# Patient Record
Sex: Male | Born: 1965 | Race: Black or African American | Hispanic: No | Smoking: Never smoker
Health system: Southern US, Community
[De-identification: ages and names within clinical notes are randomized; demographics above are authoritative.]

## PROBLEM LIST (undated history)

## (undated) DIAGNOSIS — Z86718 Personal history of other venous thrombosis and embolism: Secondary | ICD-10-CM

## (undated) DIAGNOSIS — I10 Essential (primary) hypertension: Secondary | ICD-10-CM

## (undated) DIAGNOSIS — E7800 Pure hypercholesterolemia, unspecified: Secondary | ICD-10-CM

## (undated) HISTORY — DX: Pure hypercholesterolemia, unspecified: E78.00

## (undated) HISTORY — DX: Essential (primary) hypertension: I10

## (undated) HISTORY — DX: Personal history of other venous thrombosis and embolism: Z86.718

## (undated) HISTORY — PX: NO PAST SURGERIES: SHX2092

---

## 2013-10-16 LAB — BASIC METABOLIC PANEL: GLUCOSE: 97 mg/dL

## 2013-10-16 LAB — HEMOGLOBIN A1C: Hgb A1c MFr Bld: 5.8 % (ref 4.0–6.0)

## 2015-01-07 ENCOUNTER — Other Ambulatory Visit
Admission: RE | Admit: 2015-01-07 | Discharge: 2015-01-07 | Disposition: A | Discharge status: Home / Self Care | Payer: Self-pay | Source: Ambulatory Visit | Attending: Family Medicine | Admitting: Family Medicine

## 2015-01-07 ENCOUNTER — Telehealth: Payer: Self-pay | Admitting: Family Medicine

## 2015-01-07 DIAGNOSIS — I10 Essential (primary) hypertension: Secondary | ICD-10-CM

## 2015-01-07 LAB — COMPREHENSIVE METABOLIC PANEL
ALK PHOS: 46 U/L (ref 38–126)
ALT: 22 U/L (ref 17–63)
AST: 23 U/L (ref 15–41)
Albumin: 3.9 g/dL (ref 3.5–5.0)
Anion gap: 7 (ref 5–15)
BUN: 11 mg/dL (ref 6–20)
CALCIUM: 9.1 mg/dL (ref 8.9–10.3)
CO2: 27 mmol/L (ref 22–32)
Chloride: 106 mmol/L (ref 101–111)
Creatinine, Ser: 1.12 mg/dL (ref 0.61–1.24)
GFR calc Af Amer: >60 mL/min (ref >60)
GLUCOSE: 115 mg/dL — AB (ref 65–99)
Potassium: 3.8 mmol/L (ref 3.5–5.1)
SODIUM: 140 mmol/L (ref 135–145)
TOTAL PROTEIN: 7.5 g/dL (ref 6.5–8.1)
Total Bilirubin: 0.5 mg/dL (ref 0.3–1.2)

## 2015-01-07 NOTE — Telephone Encounter (Signed)
Printed pt lab slip and it is ready to be picked up. Called pt.

## 2015-01-07 NOTE — Telephone Encounter (Signed)
Pt called wanting a lab order for CMP (the original order was done in [employer] on 07/11/14 using diagnosis code I10) so that he can go over to [facility] and have it done today.  He will come by here today to pick up order.  He is wanting to have this lab done before his visit.

## 2015-01-13 ENCOUNTER — Ambulatory Visit (INDEPENDENT_AMBULATORY_CARE_PROVIDER_SITE_OTHER): Payer: Self-pay | Admitting: Family Medicine

## 2015-01-13 ENCOUNTER — Encounter: Payer: Self-pay | Admitting: Family Medicine

## 2015-01-13 VITALS — BP 128/88 | HR 66 | Temp 98.4°F | Resp 18 | Ht 75.0 in | Wt 340.2 lb

## 2015-01-13 DIAGNOSIS — R7303 Prediabetes: Secondary | ICD-10-CM | POA: Insufficient documentation

## 2015-01-13 DIAGNOSIS — N529 Male erectile dysfunction, unspecified: Secondary | ICD-10-CM | POA: Insufficient documentation

## 2015-01-13 DIAGNOSIS — E8881 Metabolic syndrome: Secondary | ICD-10-CM | POA: Insufficient documentation

## 2015-01-13 DIAGNOSIS — E669 Obesity, unspecified: Secondary | ICD-10-CM | POA: Insufficient documentation

## 2015-01-13 DIAGNOSIS — I10 Essential (primary) hypertension: Secondary | ICD-10-CM | POA: Insufficient documentation

## 2015-01-13 MED ORDER — LISINOPRIL-HYDROCHLOROTHIAZIDE 20-12.5 MG PO TABS: 1.0000 | ORAL_TABLET | Freq: Every day | ORAL | Status: DC

## 2015-01-13 NOTE — Progress Notes (Signed)
Name: Austin Howell   MRN: [id number]    DOB: [birth year]   Date:01/13/2015       Progress Note  Subjective  Chief Complaint  Chief Complaint  Patient presents with  . Hypertension  . Obesity    Hypertension This is a chronic problem. The current episode started more than 1 year ago. The problem has been gradually improving since onset. Pertinent negatives include no blurred vision, chest pain, headaches, neck pain, orthopnea, palpitations or shortness of breath. There are no associated agents to hypertension. Risk factors for coronary artery disease include male gender, obesity and sedentary lifestyle. Past treatments include angiotensin blockers and diuretics. The current treatment provides moderate improvement. There are no compliance problems.    Obesity  Patient has a long-standing history of obesity. Since last visit his weight at 3:30 for his increased to 6340. He is not exercising with regularity nor following a diet with regularity.    No past medical history on file.  History  Substance Use Topics  . Smoking status: Never Smoker   . Smokeless tobacco: Not on file  . Alcohol Use: No     Current outpatient prescriptions:  .  lisinopril-hydrochlorothiazide (PRINZIDE,ZESTORETIC) 20-12.5 MG per tablet, 2 tab by mouth every morning, Disp: , Rfl:  .  tadalafil (CIALIS) 20 MG tablet, 1 tab by mouth as needed, 30 minutes before intercourse, do not repeat in 36 hours. Do not use nitroglycerine tablets., Disp: , Rfl:   No Known Allergies  Review of Systems  Constitutional: Negative for fever, chills and weight loss.  HENT: Negative for congestion, hearing loss, sore throat and tinnitus.   Eyes: Negative for blurred vision, double vision and redness.  Respiratory: Negative for cough, hemoptysis and shortness of breath.   Cardiovascular: Positive for leg swelling. Negative for chest pain, palpitations, orthopnea and claudication.  Gastrointestinal: Negative for heartburn, nausea,  vomiting, diarrhea, constipation and blood in stool.  Genitourinary: Negative for dysuria, urgency, frequency and hematuria.  Musculoskeletal: Negative for myalgias, back pain, joint pain, falls and neck pain.  Skin: Negative for itching.  Neurological: Negative for dizziness, tingling, tremors, focal weakness, seizures, loss of consciousness, weakness and headaches.  Endo/Heme/Allergies: Does not bruise/bleed easily.  Psychiatric/Behavioral: Negative for depression and substance abuse. The patient is not nervous/anxious and does not have insomnia.      Objective  Filed Vitals:   01/13/15 0832  BP: 128/88  Pulse: 66  Temp: 98.4 F (36.9 C)  Resp: 18  Height:  (1.905 m)  Weight: 340 lb 4 oz (154.336 kg)  SpO2: 98%     Physical Exam  Constitutional: He is oriented to person, place, and time and well-developed, well-nourished, and in no distress.  Obese  HENT:  Head: Normocephalic.  Eyes: EOM are normal. Pupils are equal, round, and reactive to light.  Neck: Normal range of motion. Neck supple. No thyromegaly present.  Cardiovascular: Normal rate, regular rhythm and normal heart sounds.   No murmur heard. Pulmonary/Chest: Effort normal and breath sounds normal. No respiratory distress. He has no wheezes.  Abdominal: Soft. Bowel sounds are normal.  Musculoskeletal: Normal range of motion. He exhibits no edema.  Lymphadenopathy:    He has no cervical adenopathy.  Neurological: He is alert and oriented to person, place, and time. No cranial nerve deficit. Gait normal. Coordination normal.  Skin: Skin is warm and dry. No rash noted.  Psychiatric: Affect and judgment normal.      Assessment & Plan  1. Essential hypertension  -  lisinopril-hydrochlorothiazide (PRINZIDE,ZESTORETIC) 20-12.5 MG per tablet; Take 1 tablet by mouth daily.  Dispense: 90 tablet; Refill: 1  2. `Obesity  Patient is encouraged to follow his obesity and weight reduction diet and exercise are  regular regularity will be reassessed in 4 months

## 2015-01-13 NOTE — Patient Instructions (Signed)

## 2015-05-19 ENCOUNTER — Ambulatory Visit: Payer: Self-pay | Admitting: Family Medicine

## 2015-06-04 ENCOUNTER — Ambulatory Visit: Payer: Self-pay | Admitting: Family Medicine

## 2018-06-28 DIAGNOSIS — E119 Type 2 diabetes mellitus without complications: Secondary | ICD-10-CM | POA: Insufficient documentation

## 2019-06-18 ENCOUNTER — Other Ambulatory Visit: Payer: Self-pay

## 2019-06-18 ENCOUNTER — Emergency Department
Admission: EM | Admit: 2019-06-18 | Discharge: 2019-06-19 | Disposition: A | Discharge status: Other Acute Inpatient Hospital | Payer: BC Managed Care – PPO | Attending: Emergency Medicine | Admitting: Emergency Medicine

## 2019-06-18 ENCOUNTER — Emergency Department: Payer: BC Managed Care – PPO

## 2019-06-18 DIAGNOSIS — I10 Essential (primary) hypertension: Secondary | ICD-10-CM | POA: Insufficient documentation

## 2019-06-18 DIAGNOSIS — U071 COVID-19: Secondary | ICD-10-CM | POA: Insufficient documentation

## 2019-06-18 DIAGNOSIS — Z79899 Other long term (current) drug therapy: Secondary | ICD-10-CM | POA: Diagnosis not present

## 2019-06-18 DIAGNOSIS — J9601 Acute respiratory failure with hypoxia: Secondary | ICD-10-CM | POA: Insufficient documentation

## 2019-06-18 DIAGNOSIS — R0602 Shortness of breath: Secondary | ICD-10-CM | POA: Diagnosis present

## 2019-06-18 LAB — PROTIME-INR
INR: 1.3 — ABNORMAL HIGH (ref 0.8–1.2)
Prothrombin Time: 16.2 seconds — ABNORMAL HIGH (ref 11.4–15.2)

## 2019-06-18 LAB — COMPREHENSIVE METABOLIC PANEL
ALT: 97 U/L — ABNORMAL HIGH (ref 0–44)
AST: 59 U/L — ABNORMAL HIGH (ref 15–41)
Albumin: 3.1 g/dL — ABNORMAL LOW (ref 3.5–5.0)
Alkaline Phosphatase: 67 U/L (ref 38–126)
Anion gap: 20 — ABNORMAL HIGH (ref 5–15)
BUN: 31 mg/dL — ABNORMAL HIGH (ref 6–20)
CO2: 21 mmol/L — ABNORMAL LOW (ref 22–32)
Calcium: 8.5 mg/dL — ABNORMAL LOW (ref 8.9–10.3)
Chloride: 100 mmol/L (ref 98–111)
Creatinine, Ser: 2.79 mg/dL — ABNORMAL HIGH (ref 0.61–1.24)
GFR calc Af Amer: 29 mL/min — ABNORMAL LOW (ref >60)
GFR calc non Af Amer: 25 mL/min — ABNORMAL LOW (ref >60)
Glucose, Bld: 205 mg/dL — ABNORMAL HIGH (ref 70–99)
Potassium: 3.9 mmol/L (ref 3.5–5.1)
Sodium: 141 mmol/L (ref 135–145)
Total Bilirubin: 1.2 mg/dL (ref 0.3–1.2)
Total Protein: 7.9 g/dL (ref 6.5–8.1)

## 2019-06-18 LAB — CBC WITH DIFFERENTIAL/PLATELET
Abs Immature Granulocytes: 0.65 10*3/uL — ABNORMAL HIGH (ref 0.00–0.07)
Basophils Absolute: 0.1 10*3/uL (ref 0.0–0.1)
Basophils Relative: 1 %
Eosinophils Absolute: 0 10*3/uL (ref 0.0–0.5)
Eosinophils Relative: 0 %
HCT: 55 % — ABNORMAL HIGH (ref 39.0–52.0)
Hemoglobin: 18.4 g/dL — ABNORMAL HIGH (ref 13.0–17.0)
Immature Granulocytes: 3 %
Lymphocytes Relative: 9 %
Lymphs Abs: 2 10*3/uL (ref 0.7–4.0)
MCH: 27.3 pg (ref 26.0–34.0)
MCHC: 33.5 g/dL (ref 30.0–36.0)
MCV: 81.6 fL (ref 80.0–100.0)
Monocytes Absolute: 1.9 10*3/uL — ABNORMAL HIGH (ref 0.1–1.0)
Monocytes Relative: 9 %
Neutro Abs: 17.3 10*3/uL — ABNORMAL HIGH (ref 1.7–7.7)
Neutrophils Relative %: 78 %
Platelets: 139 10*3/uL — ABNORMAL LOW (ref 150–400)
RBC: 6.74 MIL/uL — ABNORMAL HIGH (ref 4.22–5.81)
RDW: 14.3 % (ref 11.5–15.5)
WBC: 22 10*3/uL — ABNORMAL HIGH (ref 4.0–10.5)
nRBC: 0.2 % (ref 0.0–0.2)

## 2019-06-18 LAB — FIBRIN DERIVATIVES D-DIMER (ARMC ONLY): Fibrin derivatives D-dimer (ARMC): >10000 ng/mL (FEU) — ABNORMAL HIGH (ref 0.00–499.00)

## 2019-06-18 LAB — LACTIC ACID, PLASMA
Lactic Acid, Venous: 4.8 mmol/L (ref 0.5–1.9)
Lactic Acid, Venous: 3.2 mmol/L (ref 0.5–1.9)

## 2019-06-18 LAB — BRAIN NATRIURETIC PEPTIDE: B Natriuretic Peptide: 543 pg/mL — ABNORMAL HIGH (ref 0.0–100.0)

## 2019-06-18 LAB — TROPONIN I (HIGH SENSITIVITY)
Troponin I (High Sensitivity): 548 ng/L (ref <18)
Troponin I (High Sensitivity): 489 ng/L (ref <18)

## 2019-06-18 LAB — APTT: aPTT: 32 seconds (ref 24–36)

## 2019-06-18 LAB — C-REACTIVE PROTEIN: CRP: 21.6 mg/dL — ABNORMAL HIGH (ref <1.0)

## 2019-06-18 LAB — PROCALCITONIN: Procalcitonin: 5.55 ng/mL

## 2019-06-18 MED ORDER — SODIUM CHLORIDE 0.9 % IV SOLN
100.0000 mg | INTRAVENOUS | Status: DC
  Filled 2019-06-18: qty 20

## 2019-06-18 MED ORDER — SODIUM CHLORIDE 0.9 % IV SOLN
200.0000 mg | Freq: Once | INTRAVENOUS | Status: AC
  Administered 2019-06-18: 20:00:00 200 mg via INTRAVENOUS
  Filled 2019-06-18: qty 40

## 2019-06-18 MED ORDER — SODIUM CHLORIDE 0.9 % IV SOLN
Freq: Once | INTRAVENOUS | Status: AC
  Administered 2019-06-18: 19:00:00 via INTRAVENOUS

## 2019-06-18 MED ORDER — SODIUM CHLORIDE 0.9 % IV BOLUS
1000.0000 mL | Freq: Once | INTRAVENOUS | Status: AC
  Administered 2019-06-18: 1000 mL via INTRAVENOUS

## 2019-06-18 MED ORDER — SODIUM CHLORIDE 0.9 % IV BOLUS: 1000.0000 mL | Freq: Once | INTRAVENOUS | Status: DC

## 2019-06-18 MED ORDER — SODIUM CHLORIDE 0.9 % IV SOLN
2.0000 g | Freq: Once | INTRAVENOUS | Status: AC
  Administered 2019-06-18: 19:00:00 2 g via INTRAVENOUS
  Filled 2019-06-18: qty 20

## 2019-06-18 MED ORDER — DEXAMETHASONE SODIUM PHOSPHATE 10 MG/ML IJ SOLN
10.0000 mg | Freq: Once | INTRAMUSCULAR | Status: AC
  Administered 2019-06-18: 19:00:00 10 mg via INTRAVENOUS
  Filled 2019-06-18: qty 1

## 2019-06-18 MED ORDER — SODIUM CHLORIDE 0.9 % IV BOLUS
500.0000 mL | Freq: Once | INTRAVENOUS | Status: AC
  Administered 2019-06-18: 19:00:00 500 mL via INTRAVENOUS

## 2019-06-18 MED ORDER — HEPARIN (PORCINE) 25000 UT/250ML-% IV SOLN
2000.0000 [IU]/h | INTRAVENOUS | Status: DC
  Administered 2019-06-18: 21:00:00 2000 [IU]/h via INTRAVENOUS
  Filled 2019-06-18 (×4): qty 250

## 2019-06-18 MED ORDER — HEPARIN BOLUS VIA INFUSION
7000.0000 [IU] | Freq: Once | INTRAVENOUS | Status: AC
  Administered 2019-06-18: 21:00:00 7000 [IU] via INTRAVENOUS
  Filled 2019-06-18: qty 7000

## 2019-06-18 MED ORDER — SODIUM CHLORIDE 0.9 % IV SOLN
500.0000 mg | Freq: Once | INTRAVENOUS | Status: AC
  Administered 2019-06-18: 500 mg via INTRAVENOUS
  Filled 2019-06-18: qty 500

## 2019-06-18 NOTE — ED Triage Notes (Signed)
Pt here via [facility]. Per EMS pt tested positive for covid 11/15. For the past two days patient has been having worsening shortness of breath and an increased cough.   EMS VS- 50-60 RR, 79% on RA- 95% 10L NRB, BP 116/80. Pt has diminished lung sounds bilaterally.   Pt denies CP.

## 2019-06-18 NOTE — ED Notes (Signed)
IV team at bedside 

## 2019-06-18 NOTE — ED Notes (Signed)
Multiple IV start attempts w/o blood return. IV time consult placed for IV access and blood draw.

## 2019-06-18 NOTE — ED Provider Notes (Addendum)
[facility] Emergency Department Provider Note  ____________________________________________   First MD Initiated Contact with Patient 06/18/19 1654     (approximate)  I have reviewed the triage vital signs and the nursing notes.   HISTORY  Chief Complaint Shortness of Breath and Covid +    HPI Austin Howell is a 53 y.o. male  Here with cough, SOB. Pt reports his sx started approx 10 days go with cough, fatigue, chills. He was diagnosed with COVID as an outpatient at that time. Since then, he had a day or two of loose diarrhea, then has felt fatigued but otherwise well. Over the past several days, he has developed worsening cough, SOB, and fatigue. He was initially SOB with exertion but then at rest. He has had persistent cough. No CP. No known fevers today. No other complaints. Denies h/o lung issues or disease. He is not diabetic. Does not smoke.        History reviewed. No pertinent past medical history.  Patient Active Problem List   Diagnosis Date Noted  . Benign essential HTN 01/13/2015  . 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 Dysmetabolic syndrome 01/13/2015  . Adiposity 01/13/2015  . 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 No pertinent surgical history.  Prior to Admission medications   Medication Sig Start Date End Date Taking? Authorizing Provider  sildenafil (VIAGRA) 25 MG tablet Take 25 mg by mouth daily as needed. 03/29/19   [provider]    Allergies Patient has no known allergies.  History reviewed. No pertinent family history.  Social History Social History   Tobacco Use  . Smoking status: Never Smoker  . Smokeless tobacco: Never Used  Substance Use Topics  . Alcohol use: No    Alcohol/week: 0.0 standard drinks  . Drug use: No    Review of Systems  Review of Systems  Constitutional: Positive for chills, fatigue and fever.  HENT: Negative for sore throat.   Respiratory:  Positive for cough and shortness of breath.   Cardiovascular: Negative for chest pain.  Gastrointestinal: Positive for diarrhea and nausea. Negative for abdominal pain.  Genitourinary: Negative for flank pain.  Musculoskeletal: Negative for neck pain.  Skin: Negative for rash and wound.  Allergic/Immunologic: Negative for immunocompromised state.  Neurological: Positive for weakness. Negative for numbness.  Hematological: Does not bruise/bleed easily.  All other systems reviewed and are negative.    ____________________________________________  PHYSICAL EXAM:      VITAL SIGNS: ED Triage Vitals  Enc Vitals Group     BP 06/18/19 1658 97/85     Pulse Rate 06/18/19 1658 (!) 123     Resp 06/18/19 1658 (!) 31     Temp --      Temp src --      SpO2 --      Weight 06/18/19 1646 300 lb (136.1 kg)     Height 06/18/19 1646 6\' 2"  (1.88 m)     Head Circumference --      Peak Flow --      Pain Score 06/18/19 1646 0     Pain Loc --      Pain Edu? --      Excl. in GC? --      Physical Exam Vitals signs and nursing note reviewed.  Constitutional:      General: He is not in acute distress.    Appearance: He is well-developed.  HENT:     Head: Normocephalic and atraumatic.  Eyes:     Conjunctiva/sclera: Conjunctivae normal.  Neck:     Musculoskeletal: Neck supple.  Cardiovascular:     Rate and Rhythm: Regular rhythm. Tachycardia present.     Heart sounds: Normal heart sounds. No murmur. No friction rub.  Pulmonary:     Effort: Pulmonary effort is normal. Tachypnea present. No respiratory distress.     Breath sounds: Examination of the right-upper field reveals rales. Examination of the left-upper field reveals rales. Examination of the right-middle field reveals rales. Examination of the left-middle field reveals rales. Examination of the right-lower field reveals rales. Examination of the left-lower field reveals rales. Decreased breath sounds and rales present. No wheezing.   Abdominal:     General: There is no distension.     Palpations: Abdomen is soft.     Tenderness: There is no abdominal tenderness.  Skin:    General: Skin is warm.     Capillary Refill: Capillary refill takes less than 2 seconds.  Neurological:     Mental Status: He is alert and oriented to person, place, and time.     Motor: No abnormal muscle tone.       ____________________________________________   LABS (all labs ordered are listed, but only abnormal results are displayed)  Labs Reviewed  CBC WITH DIFFERENTIAL/PLATELET - Abnormal; Notable for the following components:      Result Value   WBC 22.0 (*)    RBC 6.74 (*)    Hemoglobin 18.4 (*)    HCT 55.0 (*)    Platelets 139 (*)    Neutro Abs 17.3 (*)    Monocytes Absolute 1.9 (*)    Abs Immature Granulocytes 0.65 (*)    All other components within normal limits  COMPREHENSIVE METABOLIC PANEL - Abnormal; Notable for the following components:   CO2 21 (*)    Glucose, Bld 205 (*)    BUN 31 (*)    Creatinine, Ser 2.79 (*)    Calcium 8.5 (*)    Albumin 3.1 (*)    AST 59 (*)    ALT 97 (*)    GFR calc non Af Amer 25 (*)    GFR calc Af Amer 29 (*)    Anion gap 20 (*)    All other components within normal limits  C-REACTIVE PROTEIN - Abnormal; Notable for the following components:   CRP 21.6 (*)    All other components within normal limits  FIBRIN DERIVATIVES D-DIMER ([facility] ONLY) - Abnormal; Notable for the following components:   Fibrin derivatives D-dimer ([facility]) >10,000.00 (*)    All other components within normal limits  LACTIC ACID, PLASMA - Abnormal; Notable for the following components:   Lactic Acid, Venous 4.8 (*)    All other components within normal limits  BRAIN NATRIURETIC PEPTIDE - Abnormal; Notable for the following components:   B Natriuretic Peptide 543.0 (*)    All other components within normal limits  LACTIC ACID, PLASMA - Abnormal; Notable for the following components:   Lactic Acid, Venous 3.2 (*)     All other components within normal limits  PROTIME-INR - Abnormal; Notable for the following components:   Prothrombin Time 16.2 (*)    INR 1.3 (*)    All other components within normal limits  TROPONIN I (HIGH SENSITIVITY) - Abnormal; Notable for the following components:   Troponin I (High Sensitivity) 548 (*)    All other components within normal limits  TROPONIN I (HIGH SENSITIVITY) - Abnormal; Notable for the following components:  Troponin I (High Sensitivity) 489 (*)    All other components within normal limits  CULTURE, BLOOD (ROUTINE X 2)  CULTURE, BLOOD (ROUTINE X 2)  PROCALCITONIN  APTT  LACTIC ACID, PLASMA  HEPARIN LEVEL (UNFRACTIONATED)    ____________________________________________  EKG: Sinus tachycardia, VR 123. QRS 109, QTc 484. No acute St elevations or depressions. Non-specific TW changes. ________________________________________  RADIOLOGY All imaging, including plain films, CT scans, and ultrasounds, independently reviewed by me, and interpretations confirmed via formal radiology reads.  ED MD interpretation:   CXR: Bilateral airspace disease, edema vs atypical infection  Official radiology report(s): Dg Chest Portable 1 View  Result Date: 06/18/2019 CLINICAL DATA:  Dyspnea, COVID-19 positive EXAM: PORTABLE CHEST 1 VIEW COMPARISON:  None. FINDINGS: Low lung volumes. Borderline mild enlargement of the cardiopericardial silhouette. Normal mediastinal contour. No pneumothorax. No pleural effusion. Hazy patchy parahilar and lower lung opacities bilaterally. IMPRESSION: Borderline mild enlargement of the cardiopericardial silhouette. Hazy patchy parahilar and lower lung opacities bilaterally, which could represent atypical pneumonia versus mild pulmonary edema. Electronically Signed   By: Delbert Phenix M.D.   On: 06/18/2019 17:10    ____________________________________________  PROCEDURES   Procedure(s) performed (including Critical Care):  .Critical  Care Performed by: Shaune Pollack, MD Authorized by: Shaune Pollack, MD   Critical care provider statement:    Critical care time (minutes):  75   Critical care time was exclusive of:  Separately billable procedures and treating other patients and teaching time   Critical care was necessary to treat or prevent imminent or life-threatening deterioration of the following conditions:  Cardiac failure, circulatory failure, respiratory failure and sepsis   Critical care was time spent personally by me on the following activities:  Development of treatment plan with patient or surrogate, discussions with consultants, evaluation of patient's response to treatment, examination of patient, obtaining history from patient or surrogate, ordering and performing treatments and interventions, ordering and review of laboratory studies, ordering and review of radiographic studies, pulse oximetry, re-evaluation of patient's condition and review of old charts   I assumed direction of critical care for this patient from another provider in my specialty: no      ____________________________________________  INITIAL IMPRESSION / MDM / ASSESSMENT AND PLAN / ED COURSE  As part of my medical decision making, I reviewed the following data within the electronic MEDICAL RECORD NUMBER Nursing notes reviewed and incorporated, Old chart reviewed, Notes from prior ED visits, and [place] Controlled Substance Database       *Austin Howell was evaluated in Emergency Department on 06/19/2019 for the symptoms described in the history of present illness. He was evaluated in the context of the global COVID-19 pandemic, which necessitated consideration that the patient might be at risk for infection with the SARS-CoV-2 virus that causes COVID-19. Institutional protocols and algorithms that pertain to the evaluation of patients at risk for COVID-19 are in a state of rapid change based on information released by regulatory bodies including the  CDC and federal and state organizations. These policies and algorithms were followed during the patient's care in the ED.  Some ED evaluations and interventions may be delayed as a result of limited staffing during the pandemic.*     Medical Decision Making:  53 yo M here with acute hypoxic respiratory failure 2/2 COVID-19 pneumonia. COVID+ on outside testing 11/14. On arrival here, he is hypoxic requiring 10L [place] with marked tachypnea. Lab work reveals significant leukocytosis, lactic acidosis, AKI, as well as elevated trop and BNP. Procal  elevated as well. Concern for multisystem involvement with possible bacterial superinfection, peri/myocarditis. CXR confirms multifocal PNA c/w COVID-19. Pt started on decadron, rocephin/azithro, remdesevir. IVF given for sepsis physiology, with downtrending LA. Repeat assessment shows improved perfusion and BP after fluids.   Discussed case with Dr. Onalee Huaavid, [facility]. I also called and discussed with [facility], who states no beds are available at this time. [facility] beds at [facility] as well.   Pt continued on supplemental O2, and will switch to HFNC to assist with work of breathing. Given + troponin, BNP, and markedly elevated D-Dimer, discussed with hospitalist and will start on heparin gtt. They recommended V/Q scan if able, though pt will be treated empirically at this time.  ____________________________________________  FINAL CLINICAL IMPRESSION(S) / ED DIAGNOSES  Final diagnoses:  COVID-19  Acute respiratory failure with hypoxia (HCC)     MEDICATIONS GIVEN DURING THIS VISIT:  Medications  remdesivir 200 mg in sodium chloride 0.9 % 250 mL IVPB (0 mg Intravenous Stopped 06/18/19 2111)    Followed by  remdesivir 100 mg in sodium chloride 0.9 % 250 mL IVPB (has no administration in time range)  heparin bolus via infusion 7,000 Units (7,000 Units Intravenous Bolus from Bag 06/18/19 2122)    Followed by  heparin ADULT infusion 100 units/mL (25000  units/24950mL sodium chloride 0.45%) (2,000 Units/hr Intravenous New Bag/Given 06/18/19 2121)  0.9 %  sodium chloride infusion ( Intravenous New Bag/Given 06/18/19 1837)  dexamethasone (DECADRON) injection 10 mg (10 mg Intravenous Given 06/18/19 1838)  sodium chloride 0.9 % bolus 500 mL (0 mLs Intravenous Stopped 06/18/19 1953)  sodium chloride 0.9 % bolus 1,000 mL (1,000 mLs Intravenous New Bag/Given 06/18/19 1916)  cefTRIAXone (ROCEPHIN) 2 g in sodium chloride 0.9 % 100 mL IVPB (0 g Intravenous Stopped 06/18/19 2011)  azithromycin (ZITHROMAX) 500 mg in sodium chloride 0.9 % 250 mL IVPB (0 mg Intravenous Stopped 06/18/19 2028)  sodium chloride 0.9 % bolus 1,000 mL (1,000 mLs Intravenous New Bag/Given 06/18/19 1918)     ED Discharge Orders    None       Note:  This document was prepared using Dragon voice recognition software and may include unintentional dictation errors.   Shaune PollackIsaacs, Kinsly Hild, MD 06/19/19 [id number]    Shaune PollackIsaacs, Naraly Fritcher, MD 06/19/19 [phone]

## 2019-06-18 NOTE — ED Notes (Signed)
Lab at bedside

## 2019-06-18 NOTE — ED Notes (Signed)
Dorian EDT at bedside

## 2019-06-18 NOTE — ED Notes (Signed)
EDT unsuccessful with blood draw.

## 2019-06-18 NOTE — ED Notes (Signed)
Date and time results received: 06/18/19 8:51 PM  (use smartphrase ".now" to insert current time)  Test: trop Critical Value: 548  Name of Provider Notified: [email or web address]  Orders Received? Or Actions Taken?: n/a

## 2019-06-18 NOTE — Consult Note (Signed)
Pharmacy Antibiotic Note  Austin Howell is Austin 53 y.o. male admitted on 06/18/2019 with COVID19 virus.  Pharmacy has been consulted for Remdesivir dosing. Patient reported symptoms started approximately 10 days ago with cough, fatigue, and chills. He was diagnosed with COVID as an outpatient at that time. Over the past several days, he has developed worsening cough, SOB and fatigue. Per ED physician, would like to initiate Remdesivir inpatient.  Plan: Remdesivir 200mg  IV x 1 dose, followed 24 hours later by 100mg  IV daily x 4 days.  Height: 6\' 2"  (188 cm) Weight: 300 lb (136.1 kg) IBW/kg (Calculated) : 82.2  Temp (24hrs), Avg:99.2 F (37.3 C), Min:99.2 F (37.3 C), Max:99.2 F (37.3 C)  Recent Labs  Lab 06/18/19 1815  WBC 22.0*  CREATININE 2.79*  LATICACIDVEN 4.8*    Estimated Creatinine Clearance: 45.5 mL/min (Austin) (by C-G formula based on SCr of 2.79 mg/dL (H)).    No Known Allergies  Antimicrobials this admission: Remdesivir 11/23 >>  Azithromycin/Rocephin 11/23 x1    Microbiology results: 11/23 BCx: pending   Thank you for allowing pharmacy to be Austin part of this patient's care.  Austin Howell Austin Howell 06/18/2019 8:15 PM

## 2019-06-18 NOTE — ED Notes (Signed)
Date and time results received: 06/18/19 7:00 PM   Test: Lactic Acid Critical Value: 4.8  Name of Provider Notified: Dr Ellender Hose

## 2019-06-18 NOTE — Consult Note (Signed)
ANTICOAGULATION CONSULT NOTE - Initial Consult  Pharmacy Consult for Heparin Infusion Indication: pulmonary embolus  No Known Allergies  Patient Measurements: Height: 6\' 2"  (188 cm) Weight: 300 lb (136.1 kg) IBW/kg (Calculated) : 82.2 Heparin Dosing Weight: 112.7 kg  Vital Signs: Temp: 99.2 F (37.3 C) (11/23 1953) BP: 92/56 (11/23 1839) Pulse Rate: 42 (11/23 1730)  Labs: Recent Labs    06/18/19 1815  HGB 18.4*  HCT 55.0*  PLT 139*  CREATININE 2.79*    Estimated Creatinine Clearance: 45.5 mL/min (A) (by C-G formula based on SCr of 2.79 mg/dL (H)).   Medical History: History reviewed. No pertinent past medical history.  Medications:  (Not in a hospital admission)  Scheduled:  . heparin  7,000 Units Intravenous Once   Infusions:  . heparin    . remdesivir 200 mg in NS 250 mL     Followed by  . [START ON 06/19/2019] remdesivir 100 mg in NS 250 mL     PRN:  Anti-infectives (From admission, onward)   Start     Dose/Rate Route Frequency Ordered Stop   06/19/19 2000  remdesivir 100 mg in sodium chloride 0.9 % 250 mL IVPB     100 mg 500 mL/hr over 30 Minutes Intravenous Every 24 hours 06/18/19 1922 06/23/19 1959   06/18/19 2000  remdesivir 200 mg in sodium chloride 0.9 % 250 mL IVPB     200 mg 500 mL/hr over 30 Minutes Intravenous Once 06/18/19 1922     06/18/19 1900  cefTRIAXone (ROCEPHIN) 2 g in sodium chloride 0.9 % 100 mL IVPB     2 g 200 mL/hr over 30 Minutes Intravenous  Once 06/18/19 1857 06/18/19 2011   06/18/19 1900  azithromycin (ZITHROMAX) 500 mg in sodium chloride 0.9 % 250 mL IVPB     500 mg 250 mL/hr over 60 Minutes Intravenous  Once 06/18/19 1857 06/18/19 2021      Assessment: Pharmacy has been consulted to initiate Heparin Infusion on 53yo patient with pulmonary embolism. Patient was also COVID+ on 11/15 and has been admitted with worsening shortness of breath and started on Remdesivir. Patient has no history of PTA anticoagulant use. Baseline  labs have been ordered and are pending.  Goal of Therapy:  Heparin level 0.3-0.7 units/ml Monitor platelets by anticoagulation protocol: Yes   Plan:  Give 7000 units bolus x 1 Start heparin infusion at 2000 units/hr Check anti-Xa level in 6 hours and daily while on heparin Platelets are currently low but unsure what baseline platelet levels may be for patient.  Continue to monitor H&H and platelets  Laurissa Cowper A Adelisa Satterwhite 06/18/2019,8:25 PM

## 2019-06-19 ENCOUNTER — Encounter: Payer: Self-pay | Admitting: Radiology

## 2019-06-19 ENCOUNTER — Inpatient Hospital Stay (HOSPITAL_COMMUNITY)
Admission: AD | Admit: 2019-06-19 | Discharge: 2019-06-22 | DRG: 871 | Disposition: A | Discharge status: Home / Self Care | Payer: BC Managed Care – PPO | Source: Other Acute Inpatient Hospital | Attending: Internal Medicine | Admitting: Internal Medicine

## 2019-06-19 ENCOUNTER — Emergency Department: Admit: 2019-06-19 | Payer: BC Managed Care – PPO

## 2019-06-19 ENCOUNTER — Encounter (HOSPITAL_COMMUNITY): Payer: Self-pay

## 2019-06-19 ENCOUNTER — Emergency Department: Payer: BC Managed Care – PPO

## 2019-06-19 DIAGNOSIS — U071 COVID-19: Secondary | ICD-10-CM | POA: Diagnosis present

## 2019-06-19 DIAGNOSIS — A4189 Other specified sepsis: Principal | ICD-10-CM | POA: Diagnosis present

## 2019-06-19 DIAGNOSIS — J1289 Other viral pneumonia: Secondary | ICD-10-CM | POA: Diagnosis present

## 2019-06-19 DIAGNOSIS — I248 Other forms of acute ischemic heart disease: Secondary | ICD-10-CM | POA: Diagnosis present

## 2019-06-19 DIAGNOSIS — I82441 Acute embolism and thrombosis of right tibial vein: Secondary | ICD-10-CM | POA: Diagnosis present

## 2019-06-19 DIAGNOSIS — I82411 Acute embolism and thrombosis of right femoral vein: Secondary | ICD-10-CM | POA: Diagnosis present

## 2019-06-19 DIAGNOSIS — Z6838 Body mass index (BMI) 38.0-38.9, adult: Secondary | ICD-10-CM

## 2019-06-19 DIAGNOSIS — I2699 Other pulmonary embolism without acute cor pulmonale: Secondary | ICD-10-CM | POA: Diagnosis present

## 2019-06-19 DIAGNOSIS — N179 Acute kidney failure, unspecified: Secondary | ICD-10-CM | POA: Diagnosis not present

## 2019-06-19 DIAGNOSIS — E860 Dehydration: Secondary | ICD-10-CM | POA: Diagnosis present

## 2019-06-19 DIAGNOSIS — A419 Sepsis, unspecified organism: Secondary | ICD-10-CM

## 2019-06-19 DIAGNOSIS — J9601 Acute respiratory failure with hypoxia: Secondary | ICD-10-CM | POA: Diagnosis present

## 2019-06-19 DIAGNOSIS — I82433 Acute embolism and thrombosis of popliteal vein, bilateral: Secondary | ICD-10-CM | POA: Diagnosis present

## 2019-06-19 DIAGNOSIS — J159 Unspecified bacterial pneumonia: Secondary | ICD-10-CM | POA: Diagnosis present

## 2019-06-19 DIAGNOSIS — I2609 Other pulmonary embolism with acute cor pulmonale: Secondary | ICD-10-CM | POA: Diagnosis not present

## 2019-06-19 LAB — URINALYSIS, ROUTINE W REFLEX MICROSCOPIC
Bacteria, UA: NONE SEEN
Bilirubin Urine: NEGATIVE
Glucose, UA: NEGATIVE mg/dL
Ketones, ur: NEGATIVE mg/dL
Leukocytes,Ua: NEGATIVE
Nitrite: NEGATIVE
Protein, ur: 30 mg/dL — AB
Specific Gravity, Urine: 1.024 (ref 1.005–1.030)
pH: 5 (ref 5.0–8.0)

## 2019-06-19 LAB — TYPE AND SCREEN
ABO/RH(D): O POS
Antibody Screen: NEGATIVE

## 2019-06-19 LAB — CBC WITH DIFFERENTIAL/PLATELET
Abs Immature Granulocytes: 0.56 10*3/uL — ABNORMAL HIGH (ref 0.00–0.07)
Basophils Absolute: 0.1 10*3/uL (ref 0.0–0.1)
Basophils Relative: 0 %
Eosinophils Absolute: 0 10*3/uL (ref 0.0–0.5)
Eosinophils Relative: 0 %
HCT: 43.4 % (ref 39.0–52.0)
Hemoglobin: 14 g/dL (ref 13.0–17.0)
Immature Granulocytes: 2 %
Lymphocytes Relative: 10 %
Lymphs Abs: 2.6 10*3/uL (ref 0.7–4.0)
MCH: 27.5 pg (ref 26.0–34.0)
MCHC: 32.3 g/dL (ref 30.0–36.0)
MCV: 85.3 fL (ref 80.0–100.0)
Monocytes Absolute: 1.7 10*3/uL — ABNORMAL HIGH (ref 0.1–1.0)
Monocytes Relative: 7 %
Neutro Abs: 20.9 10*3/uL — ABNORMAL HIGH (ref 1.7–7.7)
Neutrophils Relative %: 81 %
Platelets: 201 10*3/uL (ref 150–400)
RBC: 5.09 MIL/uL (ref 4.22–5.81)
RDW: 13.8 % (ref 11.5–15.5)
WBC: 25.8 10*3/uL — ABNORMAL HIGH (ref 4.0–10.5)
nRBC: 0.1 % (ref 0.0–0.2)

## 2019-06-19 LAB — HEPARIN LEVEL (UNFRACTIONATED)
Heparin Unfractionated: 2.18 IU/mL — ABNORMAL HIGH (ref 0.30–0.70)
Heparin Unfractionated: 0.48 IU/mL (ref 0.30–0.70)
Heparin Unfractionated: 0.48 IU/mL (ref 0.30–0.70)

## 2019-06-19 LAB — CBC
HCT: 41.1 % (ref 39.0–52.0)
Hemoglobin: 13.3 g/dL (ref 13.0–17.0)
MCH: 27.2 pg (ref 26.0–34.0)
MCHC: 32.4 g/dL (ref 30.0–36.0)
MCV: 84 fL (ref 80.0–100.0)
Platelets: 197 10*3/uL (ref 150–400)
RBC: 4.89 MIL/uL (ref 4.22–5.81)
RDW: 13.8 % (ref 11.5–15.5)
WBC: 24.3 10*3/uL — ABNORMAL HIGH (ref 4.0–10.5)
nRBC: 0 % (ref 0.0–0.2)

## 2019-06-19 LAB — COMPREHENSIVE METABOLIC PANEL
ALT: 102 U/L — ABNORMAL HIGH (ref 0–44)
AST: 106 U/L — ABNORMAL HIGH (ref 15–41)
Albumin: 2.8 g/dL — ABNORMAL LOW (ref 3.5–5.0)
Alkaline Phosphatase: 71 U/L (ref 38–126)
Anion gap: 12 (ref 5–15)
BUN: 31 mg/dL — ABNORMAL HIGH (ref 6–20)
CO2: 17 mmol/L — ABNORMAL LOW (ref 22–32)
Calcium: 7.7 mg/dL — ABNORMAL LOW (ref 8.9–10.3)
Chloride: 109 mmol/L (ref 98–111)
Creatinine, Ser: 1.49 mg/dL — ABNORMAL HIGH (ref 0.61–1.24)
GFR calc Af Amer: >60 mL/min (ref >60)
GFR calc non Af Amer: 53 mL/min — ABNORMAL LOW (ref >60)
Glucose, Bld: 167 mg/dL — ABNORMAL HIGH (ref 70–99)
Potassium: 5.1 mmol/L (ref 3.5–5.1)
Sodium: 138 mmol/L (ref 135–145)
Total Bilirubin: 1.1 mg/dL (ref 0.3–1.2)
Total Protein: 6.9 g/dL (ref 6.5–8.1)

## 2019-06-19 LAB — HIV ANTIBODY (ROUTINE TESTING W REFLEX): HIV Screen 4th Generation wRfx: NONREACTIVE

## 2019-06-19 LAB — OSMOLALITY: Osmolality: 304 mOsm/kg — ABNORMAL HIGH (ref 275–295)

## 2019-06-19 LAB — FIBRIN DERIVATIVES D-DIMER (ARMC ONLY): Fibrin derivatives D-dimer (ARMC): >7500 ng/mL (FEU) — ABNORMAL HIGH (ref 0.00–499.00)

## 2019-06-19 LAB — OSMOLALITY, URINE: Osmolality, Ur: 734 mOsm/kg (ref 300–900)

## 2019-06-19 LAB — CREATININE, SERUM
Creatinine, Ser: 1.48 mg/dL — ABNORMAL HIGH (ref 0.61–1.24)
GFR calc Af Amer: >60 mL/min (ref >60)
GFR calc non Af Amer: 54 mL/min — ABNORMAL LOW (ref >60)

## 2019-06-19 LAB — ABO/RH: ABO/RH(D): O POS

## 2019-06-19 LAB — PROCALCITONIN
Procalcitonin: 3.47 ng/mL
Procalcitonin: 3.34 ng/mL

## 2019-06-19 LAB — GLUCOSE, CAPILLARY
Glucose-Capillary: 132 mg/dL — ABNORMAL HIGH (ref 70–99)
Glucose-Capillary: 221 mg/dL — ABNORMAL HIGH (ref 70–99)

## 2019-06-19 LAB — HEMOGLOBIN A1C
Hgb A1c MFr Bld: 6.8 % — ABNORMAL HIGH (ref 4.8–5.6)
Mean Plasma Glucose: 148.46 mg/dL

## 2019-06-19 LAB — C-REACTIVE PROTEIN: CRP: 18.2 mg/dL — ABNORMAL HIGH (ref <1.0)

## 2019-06-19 LAB — FERRITIN: Ferritin: 2070 ng/mL — ABNORMAL HIGH (ref 24–336)

## 2019-06-19 LAB — LACTATE DEHYDROGENASE: LDH: 1025 U/L — ABNORMAL HIGH (ref 98–192)

## 2019-06-19 LAB — CREATININE, URINE, RANDOM: Creatinine, Urine: 286.29 mg/dL

## 2019-06-19 LAB — SODIUM, URINE, RANDOM: Sodium, Ur: <10 mmol/L

## 2019-06-19 MED ORDER — TECHNETIUM TO 99M ALBUMIN AGGREGATED
4.0000 | Freq: Once | INTRAVENOUS | Status: AC | PRN
  Administered 2019-06-19: 12:00:00 4.452 via INTRAVENOUS

## 2019-06-19 MED ORDER — SODIUM CHLORIDE 0.9% IV SOLUTION: Freq: Once | INTRAVENOUS | Status: DC

## 2019-06-19 MED ORDER — SODIUM CHLORIDE 0.9 % IV BOLUS
1000.0000 mL | Freq: Once | INTRAVENOUS | Status: AC
  Administered 2019-06-19: 1000 mL via INTRAVENOUS

## 2019-06-19 MED ORDER — SODIUM CHLORIDE 0.9 % IV SOLN
100.0000 mg | Freq: Every day | INTRAVENOUS | Status: AC
  Administered 2019-06-19 – 2019-06-22 (×4): 100 mg via INTRAVENOUS
  Filled 2019-06-19: qty 100
  Filled 2019-06-19: qty 20
  Filled 2019-06-19 (×3): qty 100

## 2019-06-19 MED ORDER — METHYLPREDNISOLONE SODIUM SUCC 125 MG IJ SOLR
80.0000 mg | Freq: Two times a day (BID) | INTRAMUSCULAR | Status: DC
  Administered 2019-06-19 – 2019-06-21 (×4): 80 mg via INTRAVENOUS
  Filled 2019-06-19 (×4): qty 2

## 2019-06-19 MED ORDER — ONDANSETRON HCL 4 MG PO TABS: 4.0000 mg | ORAL_TABLET | Freq: Four times a day (QID) | ORAL | Status: DC | PRN

## 2019-06-19 MED ORDER — LEVOFLOXACIN 500 MG PO TABS: 500.0000 mg | ORAL_TABLET | Freq: Once | ORAL | Status: DC

## 2019-06-19 MED ORDER — METHYLPREDNISOLONE SODIUM SUCC 125 MG IJ SOLR: 80.0000 mg | INTRAMUSCULAR | Status: DC

## 2019-06-19 MED ORDER — HEPARIN (PORCINE) 25000 UT/250ML-% IV SOLN
2150.0000 [IU]/h | INTRAVENOUS | Status: AC
  Administered 2019-06-19: 2000 [IU]/h via INTRAVENOUS
  Administered 2019-06-20: 2050 [IU]/h via INTRAVENOUS
  Administered 2019-06-21: 2150 [IU]/h via INTRAVENOUS
  Filled 2019-06-19 (×4): qty 250

## 2019-06-19 MED ORDER — INSULIN ASPART 100 UNIT/ML ~~LOC~~ SOLN
0.0000 [IU] | Freq: Three times a day (TID) | SUBCUTANEOUS | Status: DC
  Administered 2019-06-19: 1 [IU] via SUBCUTANEOUS
  Administered 2019-06-20: 2 [IU] via SUBCUTANEOUS
  Administered 2019-06-20 (×2): 3 [IU] via SUBCUTANEOUS
  Administered 2019-06-21: 2 [IU] via SUBCUTANEOUS
  Administered 2019-06-21: 5 [IU] via SUBCUTANEOUS
  Administered 2019-06-21: 3 [IU] via SUBCUTANEOUS
  Administered 2019-06-22: 2 [IU] via SUBCUTANEOUS
  Administered 2019-06-22: 3 [IU] via SUBCUTANEOUS

## 2019-06-19 MED ORDER — SODIUM CHLORIDE 0.9 % IV SOLN
100.0000 mg | Freq: Two times a day (BID) | INTRAVENOUS | Status: DC
  Administered 2019-06-19 – 2019-06-22 (×6): 100 mg via INTRAVENOUS
  Filled 2019-06-19 (×8): qty 100

## 2019-06-19 MED ORDER — ACETAMINOPHEN 325 MG PO TABS: 650.0000 mg | ORAL_TABLET | Freq: Four times a day (QID) | ORAL | Status: DC | PRN

## 2019-06-19 MED ORDER — ALBUTEROL SULFATE HFA 108 (90 BASE) MCG/ACT IN AERS
2.0000 | INHALATION_SPRAY | Freq: Four times a day (QID) | RESPIRATORY_TRACT | Status: DC | PRN
  Filled 2019-06-19: qty 6.7

## 2019-06-19 MED ORDER — ONDANSETRON HCL 4 MG/2ML IJ SOLN: 4.0000 mg | Freq: Four times a day (QID) | INTRAMUSCULAR | Status: DC | PRN

## 2019-06-19 MED ORDER — LACTATED RINGERS IV SOLN
INTRAVENOUS | Status: AC
  Administered 2019-06-19: 21:00:00 via INTRAVENOUS

## 2019-06-19 MED ORDER — SODIUM CHLORIDE 0.9 % IV SOLN
2.0000 g | INTRAVENOUS | Status: AC
  Administered 2019-06-19 – 2019-06-21 (×3): 2 g via INTRAVENOUS
  Filled 2019-06-19 (×3): qty 20

## 2019-06-19 MED ORDER — SENNOSIDES-DOCUSATE SODIUM 8.6-50 MG PO TABS: 1.0000 | ORAL_TABLET | Freq: Every evening | ORAL | Status: DC | PRN

## 2019-06-19 MED ORDER — INSULIN ASPART 100 UNIT/ML ~~LOC~~ SOLN
0.0000 [IU] | Freq: Every day | SUBCUTANEOUS | Status: DC
  Administered 2019-06-19 – 2019-06-20 (×2): 2 [IU] via SUBCUTANEOUS

## 2019-06-19 NOTE — ED Notes (Addendum)
Pt transported for Vq scan, pt switched to non rebreather at this time

## 2019-06-19 NOTE — Consult Note (Signed)
ANTICOAGULATION CONSULT NOTE - Initial Consult  Pharmacy Consult for Heparin Infusion Indication: pulmonary embolus  No Known Allergies  Patient Measurements: Height: 6\' 2"  (188 cm) Weight: 300 lb (136.1 kg) IBW/kg (Calculated) : 82.2 Heparin Dosing Weight: 112.7 kg  Vital Signs: Temp: 99.2 F (37.3 C) (11/23 1953) BP: 112/85 (11/24 0500) Pulse Rate: 100 (11/24 0500)  Labs: Recent Labs    06/18/19 1815 06/18/19 1953 06/18/19 2146 06/19/19 0409  HGB 18.4*  --   --   --   HCT 55.0*  --   --   --   PLT 139*  --   --   --   APTT 32  --   --   --   LABPROT 16.2*  --   --   --   INR 1.3*  --   --   --   HEPARINUNFRC  --   --   --  2.18*  CREATININE 2.79*  --   --   --   TROPONINIHS  --  548* 489*  --     Estimated Creatinine Clearance: 45.5 mL/min (A) (by C-G formula based on SCr of 2.79 mg/dL (H)).   Medical History: History reviewed. No pertinent past medical history.  Medications:  (Not in a hospital admission)  Scheduled:   Infusions:  . heparin 2,000 Units/hr (06/18/19 2121)  . remdesivir 100 mg in NS 250 mL     PRN:  Anti-infectives (From admission, onward)   Start     Dose/Rate Route Frequency Ordered Stop   06/19/19 2000  remdesivir 100 mg in sodium chloride 0.9 % 250 mL IVPB     100 mg 500 mL/hr over 30 Minutes Intravenous Every 24 hours 06/18/19 1922 06/23/19 1959   06/18/19 2000  remdesivir 200 mg in sodium chloride 0.9 % 250 mL IVPB     200 mg 500 mL/hr over 30 Minutes Intravenous Once 06/18/19 1922 06/18/19 2111   06/18/19 1900  cefTRIAXone (ROCEPHIN) 2 g in sodium chloride 0.9 % 100 mL IVPB     2 g 200 mL/hr over 30 Minutes Intravenous  Once 06/18/19 1857 06/18/19 2011   06/18/19 1900  azithromycin (ZITHROMAX) 500 mg in sodium chloride 0.9 % 250 mL IVPB     500 mg 250 mL/hr over 60 Minutes Intravenous  Once 06/18/19 1857 06/18/19 2028      Assessment: Pharmacy has been consulted to initiate Heparin Infusion on 53yo patient with pulmonary  embolism. Patient was also COVID+ on 11/15 and has been admitted with worsening shortness of breath and started on Remdesivir. Patient has no history of PTA anticoagulant use. Baseline labs have been ordered and are pending.  Goal of Therapy:  Heparin level 0.3-0.7 units/ml Monitor platelets by anticoagulation protocol: Yes   Plan:  Give 7000 units bolus x 1 Start heparin infusion at 2000 units/hr Check anti-Xa level in 6 hours and daily while on heparin Platelets are currently low but unsure what baseline platelet levels may be for patient.  Continue to monitor H&H and platelets  11/24: HL @ 0409 = 2.19 Spoke with ED RN and she confirmed HL was drawn from same line as heparin is infusing in.  RN says pt has limited IV access.  Called lab and they will send up phlebotomist to collect HL in line other than the one used to infuse heparin.  Ordered new HL for 0545.   Austin Howell D 06/19/2019,5:40 AM

## 2019-06-19 NOTE — ED Notes (Signed)
Pt returned to room. Pt placed on non rebreather at 10L

## 2019-06-19 NOTE — ED Notes (Signed)
Pt given drink at this time 

## 2019-06-19 NOTE — ED Provider Notes (Signed)
-----------------------------------------   1:16 PM on 06/19/2019 -----------------------------------------  Notified by radiology that patient's perfusion scan shows multiple wedge-shaped perfusion defects indicating high probability of PE.  Patient has had heparin drip infusing since last night and remains hemodynamically stable.  Troponin downtrending on last recheck.  He remained stable on high flow nasal cannula, was transitioned to nonrebreather in order to assist with transport to [facility].  Case discussed with Dr. Candiss Norse at [facility], who was made aware of patient's positive perfusion scan.  Patient is not a candidate for Actemra given his grossly elevated procalcitonin.  Dr. Candiss Norse request that we draw type and screen so that he may receive convalescent plasma once he arrives to [facility].  Patient remained stable on nonrebreather at time of transfer to [facility].   Blake Divine, MD 06/19/19 1626

## 2019-06-19 NOTE — Consult Note (Addendum)
ANTICOAGULATION CONSULT NOTE  Pharmacy Consult for Heparin Infusion Indication: pulmonary embolus  No Known Allergies  Patient Measurements: Height: 6\' 2"  (188 cm) Weight: 300 lb (136.1 kg) IBW/kg (Calculated) : 82.2 Heparin Dosing Weight: 112.7 kg  Vital Signs: Temp: 99.2 F (37.3 C) (11/23 1953) BP: 121/77 (11/24 0630) Pulse Rate: 97 (11/24 0630)  Labs: Recent Labs    06/18/19 1815 06/18/19 1953 06/18/19 2146 06/19/19 0409 06/19/19 0640  HGB 18.4*  --   --   --   --   HCT 55.0*  --   --   --   --   PLT 139*  --   --   --   --   APTT 32  --   --   --   --   LABPROT 16.2*  --   --   --   --   INR 1.3*  --   --   --   --   HEPARINUNFRC  --   --   --  2.18* 0.48  CREATININE 2.79*  --   --   --   --   TROPONINIHS  --  548* 489*  --   --     Estimated Creatinine Clearance: 45.5 mL/min (A) (by C-G formula based on SCr of 2.79 mg/dL (H)).   Medical History: History reviewed. No pertinent past medical history.  Medications:  (Not in a hospital admission)  Scheduled:   Infusions:  . heparin 2,000 Units/hr (06/18/19 2121)  . remdesivir 100 mg in NS 250 mL     PRN:  Anti-infectives (From admission, onward)   Start     Dose/Rate Route Frequency Ordered Stop   06/19/19 2000  remdesivir 100 mg in sodium chloride 0.9 % 250 mL IVPB     100 mg 500 mL/hr over 30 Minutes Intravenous Every 24 hours 06/18/19 1922 06/23/19 1959   06/18/19 2000  remdesivir 200 mg in sodium chloride 0.9 % 250 mL IVPB     200 mg 500 mL/hr over 30 Minutes Intravenous Once 06/18/19 1922 06/18/19 2111   06/18/19 1900  cefTRIAXone (ROCEPHIN) 2 g in sodium chloride 0.9 % 100 mL IVPB     2 g 200 mL/hr over 30 Minutes Intravenous  Once 06/18/19 1857 06/18/19 2011   06/18/19 1900  azithromycin (ZITHROMAX) 500 mg in sodium chloride 0.9 % 250 mL IVPB     500 mg 250 mL/hr over 60 Minutes Intravenous  Once 06/18/19 1857 06/18/19 2028      Assessment: Pharmacy has been consulted to initiate Heparin  Infusion on 53yo patient with pulmonary embolism. Patient was also COVID+ on 11/15 and has been admitted with worsening shortness of breath and started on Remdesivir. Patient has no history of PTA anticoagulant use. Baseline labs have been ordered and are pending.  Goal of Therapy:  Heparin level 0.3-0.7 units/ml Monitor platelets by anticoagulation protocol: Yes   Plan:  Give 7000 units bolus x 1 Start heparin infusion at 2000 units/hr Check anti-Xa level in 6 hours and daily while on heparin Platelets are currently low but unsure what baseline platelet levels may be for patient.  Continue to monitor H&H and platelets  11/24: HL @ 0409 = 2.19 Spoke with ED RN and she confirmed HL was drawn from same line as heparin is infusing in.  RN says pt has limited IV access.  Called lab and they will send up phlebotomist to collect HL in line other than the one used to infuse heparin.  Ordered  new HL for 0545.   11/24: HL @ 0640 0.48. Therapeutic x1. Will continue current rate. Will recheck HL/CBC in 6 hours.   Katha Cabal 06/19/2019,7:52 AM

## 2019-06-19 NOTE — Progress Notes (Signed)
Pt was tx from [place] for COVID and PE. He has gotten remdesivir started there along with his IV heparin. We will try to keep him on heparin for now until his scr improves. His heparin level has been therapeutic x2. We will ask the MD to change to Lovenox at that point.   Remdesivir 100mg  IV q24 x4 F/u ALT Continue heparin at 2000 units/hr Daily heparin level  Onnie Boer, PharmD, [place], AAHIVP, CPP Infectious Disease Pharmacist 06/19/2019 3:21 PM

## 2019-06-19 NOTE — Plan of Care (Signed)
  Problem: Education: Goal: Knowledge of risk factors and measures for prevention of condition will improve Outcome: Progressing   Problem: Coping: Goal: Psychosocial and spiritual needs will be supported Outcome: Progressing   Problem: Respiratory: Goal: Will maintain a patent airway Outcome: Progressing Goal: Complications related to the disease process, condition or treatment will be avoided or minimized Outcome: Progressing   

## 2019-06-19 NOTE — ED Notes (Signed)
Lab contacted by RN to draw current blood work

## 2019-06-19 NOTE — H&P (Addendum)
TRH H&P   Patient Demographics:    Austin Howell, is a 53 y.o. male  MRN: [id number]   DOB - Dec 28, 1965  Admit Date - 06/19/2019  Outpatient Primary MD for the patient is System, Pcp Not In  Patient coming from: [facility]  CC - SOB    HPI:    Austin Howell  is a 53 y.o. male, with no previous medical or surgical problems comes takes no medically needed prescription occasions, no known allergies who is been feeling feverish with some fatigue and cough for the last 7 to 10 days, he went to [facility] in [facility] where he was diagnosed with positive COVID-19 infection about 10 days ago.  About 5 days ago he started developing some shortness of breath which got progressively worse, he continued to have a dry cough, no other subjective complaints except mild fatigue.  He came to the [facility] ER yesterday where he was found to have COVID-19 pneumonia causing acute hypoxic respiratory failure, dehydration, AKI, bilateral PE on VQ scan, non-ACS pattern troponin rise, he was started on heparin drip, thereafter he was transferred today to [facility] for further care.  Patient upon arrival appears stable, he is in no distress, besides mild exertional shortness of breath and a dry cough along with some fatigue he is surprisingly symptom-free.    Review of systems:    A full 10 point Review of Systems was done, except as stated above, all other Review of Systems were negative.   With Past History of the following :   He denies any past medical or surgical history.  Takes no prescription medications, no allergies    Social History:     Social History   Tobacco Use  . Smoking status: Never Smoker  . Smokeless  tobacco: Never Used  Substance Use Topics  . Alcohol use: No    Alcohol/week: 0.0 standard drinks         Family History :   No family history of CAD   Home Medications:   Prior to Admission medications   Medication Sig Start Date End Date Taking? Authorizing Provider  sildenafil (VIAGRA) 25 MG tablet Take 25 mg by mouth daily as needed. 03/29/19   [provider]     Allergies:    No Known Allergies   Physical Exam:  Vitals  Blood pressure 111/61, pulse (!) 109, temperature 97.9 F (36.6 C), temperature source Oral, resp. rate (!) 21, SpO2 93 %.   1. General middle-aged African-American male lying in hospital bed in no distress  2. Normal affect and insight, Not Suicidal or Homicidal, Awake Alert, Oriented X 3.  3. No F.N deficits, ALL C.Nerves Intact, Strength 5/5 all 4 extremities, Sensation intact all 4 extremities, Plantars down going.  4. Ears and Eyes appear Normal, Conjunctivae clear, PERRLA. Moist Oral Mucosa.  5. Supple Neck, No JVD, No cervical lymphadenopathy appriciated, No Carotid Bruits.  6. Symmetrical Chest wall movement, Good air movement bilaterally, CTAB.  7. RRR, No Gallops, Rubs or Murmurs, No Parasternal Heave.  8. Positive Bowel Sounds, Abdomen Soft, No tenderness, No organomegaly appriciated,No rebound -guarding or rigidity.  9.  No Cyanosis, Normal Skin Turgor, No Skin Rash or Bruise.  10. Good muscle tone,  joints appear normal , legs appear swollen right more than left  11. No Palpable Lymph Nodes in Neck or Axillae      Data Review:    CBC Recent Labs  Lab 06/18/19 1815 06/19/19 1333  WBC 22.0* 24.3*  HGB 18.4* 13.3  HCT 55.0* 41.1  PLT 139* 197  MCV 81.6 84.0  MCH 27.3 27.2  MCHC 33.5 32.4  RDW 14.3 13.8  LYMPHSABS 2.0  --   MONOABS 1.9*  --   EOSABS 0.0  --   BASOSABS 0.1  --     ------------------------------------------------------------------------------------------------------------------  Chemistries  Recent Labs  Lab 06/18/19 1815 06/19/19 1333  NA 141  --   K 3.9  --   CL 100  --   CO2 21*  --   GLUCOSE 205*  --   BUN 31*  --   CREATININE 2.79* 1.48*  CALCIUM 8.5*  --   AST 59*  --   ALT 97*  --   ALKPHOS 67  --   BILITOT 1.2  --    ------------------------------------------------------------------------------------------------------------------ estimated creatinine clearance is 85.7 mL/min (A) (by C-G formula based on SCr of 1.48 mg/dL (H)). ------------------------------------------------------------------------------------------------------------------ No results for input(s): TSH, T4TOTAL, T3FREE, THYROIDAB in the last 72 hours.  Invalid input(s): FREET3  Coagulation profile Recent Labs  Lab 06/18/19 1815  INR 1.3*   ------------------------------------------------------------------------------------------------------------------- No results for input(s): DDIMER in the last 72 hours. -------------------------------------------------------------------------------------------------------------------  Cardiac Enzymes No results for input(s): CKMB, TROPONINI, MYOGLOBIN in the last 168 hours.  Invalid input(s): CK ------------------------------------------------------------------------------------------------------------------    Component Value Date/Time   BNP 543.0 (H) 06/18/2019 1815     ---------------------------------------------------------------------------------------------------------------  Urinalysis No results found for: COLORURINE, APPEARANCEUR, LABSPEC, PHURINE, GLUCOSEU, HGBUR, BILIRUBINUR, KETONESUR, PROTEINUR, UROBILINOGEN, NITRITE, LEUKOCYTESUR  Coronavirus (COVID-19) SARS-CoV-2 PCR DETECTED (A)   Specimen Collected on  Other - Nasopharyngeal swab (specimen) 06/09/2019 4:55 PM      ----------------------------------------------------------------------------------------------------------------   Imaging Results:    Nm Pulmonary Perfusion  Addendum Date: 06/19/2019   ADDENDUM REPORT: 06/19/2019 12:50 ADDENDUM: Critical Value/emergent results were called by telephone at the time of interpretation on 06/19/2019 at 1233 hours to provider Dr. Larinda ButteryJessup, who verbally acknowledged these results. Electronically Signed   By: Odessa FlemingH  Hall M.D.   On: 06/19/2019 12:50   Result Date: 06/19/2019 CLINICAL DATA:  53 year old COVID-19 positive male with shortness of breath, cough, chest pain and renal insufficiency precluding CTA. Perfusion only V/Q scan requested. EXAM: NUCLEAR MEDICINE PERFUSION LUNG SCAN TECHNIQUE: Perfusion images were obtained in multiple projections after intravenous injection of radiopharmaceutical. Ventilation scans intentionally deferred if perfusion scan  and chest x-ray adequate for interpretation during COVID 19 epidemic. RADIOPHARMACEUTICALS:  4.5 mCi Tc-90m MAA IV COMPARISON:  Portable chest 06/18/2019. FINDINGS: Multifocal peripheral and wedge-shaped perfusion defects in both lungs, more numerous on the left. Sizable right mid lung peripheral perfusion defect. Portable chest yesterday demonstrated minimal streaky opacity in the left mid lung only. Lung volumes today appear stable to those yesterday. IMPRESSION: Multiple bilateral linear and wedge-shaped perfusion defects constituting High Probability Of Acute PE. Electronically Signed: By: Odessa Fleming M.D. On: 06/19/2019 12:22   Dg Chest Portable 1 View  Result Date: 06/18/2019 CLINICAL DATA:  Dyspnea, COVID-19 positive EXAM: PORTABLE CHEST 1 VIEW COMPARISON:  None. FINDINGS: Low lung volumes. Borderline mild enlargement of the cardiopericardial silhouette. Normal mediastinal contour. No pneumothorax. No pleural effusion. Hazy patchy parahilar and lower lung opacities bilaterally. IMPRESSION: Borderline mild enlargement  of the cardiopericardial silhouette. Hazy patchy parahilar and lower lung opacities bilaterally, which could represent atypical pneumonia versus mild pulmonary edema. Electronically Signed   By: Delbert Phenix M.D.   On: 06/18/2019 17:10    My personal review of EKG: Rhythm NSR, extremely poor quality EKG done in the ER, and is tachycardia at 123 bpm with nonspecific ST changes   Assessment & Plan:     1.  Acute hypoxic respiratory failure due to COVID-19 pneumonitis also causing sepsis, dehydration and AKI. He surprisingly does not appear toxic or in any major respiratory distress, his inflammatory markers are elevated and he has been started on high-dose IV Solu-Medrol, remdesivir and will also get convalescent plasma for which he has consented.  Unfortunately his procalcitonin and leukocyte count are quite elevated hence Actemra was not given although he has consented for that also in case his situation gets worse and he needs it.  For now he appears quite stable to me despite his numbers, he is requiring 3 L nasal cannula oxygen and is in no respiratory distress.  We will monitor him closely.  2.  Bilateral PE.  Heparin drip, check lower extremity venous duplex and trend D-dimer.  3.  Sepsis, dehydration, AKI.  Due to #1 above.  Hydrate and monitor.  We will also obtain urine cultures, follow blood cultures from [place] which are so far negative.  Since he has impressive leukocytosis for now we will place him on IV doxycycline.  Although I doubt he has a bacterial component.  I think his lactate and procalcitonin are elevated due to combination of dehydration and sepsis and extreme stress from COVID-19.  He has a dry cough, but follow urine cultures and blood cultures.  Trend lactic acid, procalcitonin and leukocyte count.  4.  Lower extremity swelling.  Lower extremity rest duplex.  Heparin drip.  5.  AKI.  As a #3 above.  Hydrate, urine electrolytes ordered.  6.  Non-ACS pattern troponin rise.   Trend is stable and downtrending, no chest pain and nonacute EKG.  Likely due to combination of demand mismatch caused by PE and hypoxia from Covid.  Currently on heparin drip.  Once better we can switch him to aspirin and low-dose beta-blocker.  Outpatient echocardiogram, A1c and lipid profile.     DVT Prophylaxis Heparin GTT  AM Labs Ordered, also please review Full Orders  Family Communication: Admission, patients condition and plan of care including tests being ordered have been discussed with the patient and daughter Fritzi Mandes who is the main decision maker who indicate understanding and agree with the plan and Code Status.  Code Status full  Likely  DC to home  Condition GUARDED    Consults called: None  Admission status: Inpatient  Time spent in minutes : 35   Lala Lund M.D on 06/19/2019 at 3:26 PM  To page go to [email or web address] - password [facility]

## 2019-06-20 ENCOUNTER — Encounter (HOSPITAL_COMMUNITY): Payer: Self-pay

## 2019-06-20 ENCOUNTER — Inpatient Hospital Stay (HOSPITAL_COMMUNITY): Payer: BC Managed Care – PPO

## 2019-06-20 DIAGNOSIS — J1289 Other viral pneumonia: Secondary | ICD-10-CM

## 2019-06-20 DIAGNOSIS — I2699 Other pulmonary embolism without acute cor pulmonale: Secondary | ICD-10-CM

## 2019-06-20 DIAGNOSIS — U071 COVID-19: Secondary | ICD-10-CM

## 2019-06-20 DIAGNOSIS — J9601 Acute respiratory failure with hypoxia: Secondary | ICD-10-CM

## 2019-06-20 LAB — CBC WITH DIFFERENTIAL/PLATELET
Abs Immature Granulocytes: 0.51 10*3/uL — ABNORMAL HIGH (ref 0.00–0.07)
Basophils Absolute: 0.1 10*3/uL (ref 0.0–0.1)
Basophils Relative: 0 %
Eosinophils Absolute: 0 10*3/uL (ref 0.0–0.5)
Eosinophils Relative: 0 %
HCT: 39.1 % (ref 39.0–52.0)
Hemoglobin: 12.7 g/dL — ABNORMAL LOW (ref 13.0–17.0)
Immature Granulocytes: 2 %
Lymphocytes Relative: 9 %
Lymphs Abs: 2 10*3/uL (ref 0.7–4.0)
MCH: 27.5 pg (ref 26.0–34.0)
MCHC: 32.5 g/dL (ref 30.0–36.0)
MCV: 84.6 fL (ref 80.0–100.0)
Monocytes Absolute: 1.4 10*3/uL — ABNORMAL HIGH (ref 0.1–1.0)
Monocytes Relative: 6 %
Neutro Abs: 18.4 10*3/uL — ABNORMAL HIGH (ref 1.7–7.7)
Neutrophils Relative %: 83 %
Platelets: 229 10*3/uL (ref 150–400)
RBC: 4.62 MIL/uL (ref 4.22–5.81)
RDW: 13.8 % (ref 11.5–15.5)
WBC: 22.3 10*3/uL — ABNORMAL HIGH (ref 4.0–10.5)
nRBC: 0 % (ref 0.0–0.2)

## 2019-06-20 LAB — COMPREHENSIVE METABOLIC PANEL
ALT: 91 U/L — ABNORMAL HIGH (ref 0–44)
AST: 83 U/L — ABNORMAL HIGH (ref 15–41)
Albumin: 2.8 g/dL — ABNORMAL LOW (ref 3.5–5.0)
Alkaline Phosphatase: 68 U/L (ref 38–126)
Anion gap: 12 (ref 5–15)
BUN: 29 mg/dL — ABNORMAL HIGH (ref 6–20)
CO2: 20 mmol/L — ABNORMAL LOW (ref 22–32)
Calcium: 8.1 mg/dL — ABNORMAL LOW (ref 8.9–10.3)
Chloride: 107 mmol/L (ref 98–111)
Creatinine, Ser: 1.15 mg/dL (ref 0.61–1.24)
GFR calc Af Amer: >60 mL/min (ref >60)
GFR calc non Af Amer: >60 mL/min (ref >60)
Glucose, Bld: 185 mg/dL — ABNORMAL HIGH (ref 70–99)
Potassium: 4.5 mmol/L (ref 3.5–5.1)
Sodium: 139 mmol/L (ref 135–145)
Total Bilirubin: 0.7 mg/dL (ref 0.3–1.2)
Total Protein: 6.9 g/dL (ref 6.5–8.1)

## 2019-06-20 LAB — C-REACTIVE PROTEIN: CRP: 12.5 mg/dL — ABNORMAL HIGH (ref <1.0)

## 2019-06-20 LAB — BRAIN NATRIURETIC PEPTIDE: B Natriuretic Peptide: 369.7 pg/mL — ABNORMAL HIGH (ref 0.0–100.0)

## 2019-06-20 LAB — GLUCOSE, CAPILLARY
Glucose-Capillary: 172 mg/dL — ABNORMAL HIGH (ref 70–99)
Glucose-Capillary: 242 mg/dL — ABNORMAL HIGH (ref 70–99)
Glucose-Capillary: 212 mg/dL — ABNORMAL HIGH (ref 70–99)
Glucose-Capillary: 224 mg/dL — ABNORMAL HIGH (ref 70–99)

## 2019-06-20 LAB — PREPARE FRESH FROZEN PLASMA: Unit division: 0

## 2019-06-20 LAB — BPAM FFP
Blood Product Expiration Date: 202011251951
ISSUE DATE / TIME: 202011242203
Unit Type and Rh: 5100

## 2019-06-20 LAB — URINE CULTURE: Culture: NO GROWTH

## 2019-06-20 LAB — D-DIMER, QUANTITATIVE: D-Dimer, Quant: >20 ug/mL-FEU — ABNORMAL HIGH (ref 0.00–0.50)

## 2019-06-20 LAB — ECHOCARDIOGRAM COMPLETE

## 2019-06-20 LAB — LACTIC ACID, PLASMA: Lactic Acid, Venous: 1.8 mmol/L (ref 0.5–1.9)

## 2019-06-20 LAB — MAGNESIUM: Magnesium: 2.1 mg/dL (ref 1.7–2.4)

## 2019-06-20 LAB — HEPARIN LEVEL (UNFRACTIONATED)
Heparin Unfractionated: <0.1 IU/mL — ABNORMAL LOW (ref 0.30–0.70)
Heparin Unfractionated: 0.31 IU/mL (ref 0.30–0.70)
Heparin Unfractionated: 0.46 IU/mL (ref 0.30–0.70)

## 2019-06-20 NOTE — Evaluation (Signed)
Physical Therapy Evaluation Patient Details Name: Austin Howell MRN: [id number] DOB: 10-08-1965 Today's Date: 06/20/2019   History of Present Illness  53 y.o. male, with no previous medical or surgical problems. presented w/ c/o fever, fatigue and cough dx w/ COVID approx 11/14. At ED he was found to have COVID-19 pneumonia causing acute hypoxic respiratory failure, dehydration, AKI, bilateral PE on VQ scan, non-ACS pattern troponin rise.  Clinical Impression   Pt admitted with above diagnosis. PTA lived home alone and was independent and working. Pt currently with functional limitations due to the deficits listed below (see PT Problem List). Pt presents with decreased strength, independence and activity tolerance, pt was on 2L/min via [place] and was able to maintain sats in 90s, HR increased to 120s with activity but was able to bring rate down with relaxation.  Pt will benefit from skilled PT to increase his overall strength, activity tolerance, independence and safety with mobility to allow discharge to the venue listed below.       Follow Up Recommendations No PT follow up    Equipment Recommendations  None recommended by PT    Recommendations for Other Services OT consult     Precautions / Restrictions Precautions Precautions: Fall Restrictions Weight Bearing Restrictions: No      Mobility  Bed Mobility Overal bed mobility: Needs Assistance Bed Mobility: Supine to Sit;Sit to Supine     Supine to sit: Supervision Sit to supine: Supervision      Transfers Overall transfer level: Needs assistance Equipment used: 1 person hand held assist Transfers: Sit to/from Stand Sit to Stand: Mod assist         General transfer comment: from elevatef bed  Ambulation/Gait Ambulation/Gait assistance: Min assist Gait Distance (Feet): 44 Feet Assistive device: 1 person hand held assist Gait Pattern/deviations: Wide base of support        Stairs            Wheelchair  Mobility    Modified Rankin (Stroke Patients Only)       Balance                                             Pertinent Vitals/Pain Pain Assessment: No/denies pain    Home Living Family/patient expects to be discharged to:: Private residence Living Arrangements: Alone Available Help at Discharge: Family;Friend(s) Type of Home: House Home Access: Stairs to enter Entrance Stairs-Rails: Can reach both Entrance Stairs-Number of Steps: 2-3          Prior Function Level of Independence: Independent               Hand Dominance        Extremity/Trunk Assessment   Upper Extremity Assessment Upper Extremity Assessment: Generalized weakness    Lower Extremity Assessment Lower Extremity Assessment: Generalized weakness       Communication   Communication: No difficulties  Cognition Arousal/Alertness: Awake/alert Behavior During Therapy: WFL for tasks assessed/performed Overall Cognitive Status: Within Functional Limits for tasks assessed                                        General Comments      Exercises Other Exercises Other Exercises: incentive spirometer x 5 w./ cues Other Exercises: flutter valve x 5 w/ cues  Assessment/Plan    PT Assessment Patient needs continued PT services  PT Problem List Decreased strength;Decreased activity tolerance;Decreased balance;Decreased mobility;Decreased coordination       PT Treatment Interventions Gait training;Functional mobility training;Therapeutic activities;Therapeutic exercise;Balance training;Cognitive remediation    PT Goals (Current goals can be found in the Care Plan section)  Acute Rehab PT Goals Time For Goal Achievement: 07/04/19 Potential to Achieve Goals: Good    Frequency Min 3X/week   Barriers to discharge Other (comment) lives home alone    Co-evaluation               AM-PAC PT "6 Clicks" Mobility  Outcome Measure Help needed turning from  your back to your side while in a flat bed without using bedrails?: None Help needed moving from lying on your back to sitting on the side of a flat bed without using bedrails?: None Help needed moving to and from a bed to a chair (including a wheelchair)?: A Little Help needed standing up from a chair using your arms (e.g., wheelchair or bedside chair)?: A Lot Help needed to walk in hospital room?: A Lot Help needed climbing 3-5 steps with a railing? : A Lot 6 Click Score: 17    End of Session Equipment Utilized During Treatment: Oxygen Activity Tolerance: Patient tolerated treatment well Patient left: in bed;with call bell/phone within reach Nurse Communication: Mobility status PT Visit Diagnosis: Other abnormalities of gait and mobility (R26.89);Muscle weakness (generalized) (M62.81)    Time: 3474-2595 PT Time Calculation (min) (ACUTE ONLY): 38 min   Charges:   PT Evaluation $PT Eval Moderate Complexity: 1 Mod PT Treatments $Therapeutic Activity: 23-37 mins        Horald Chestnut, PT   Delford Field 06/20/2019, 1:32 PM

## 2019-06-20 NOTE — Progress Notes (Signed)
Lower extremity venous has been completed.   Preliminary results in CV Proc.   Abram Sander 06/20/2019 1:44 PM

## 2019-06-20 NOTE — Progress Notes (Signed)
Kieth Brightly, RN IV team nurse arrived to patients room and patient stated IV was already started. Patient's RN instructed to discontinue order since IV already placed.

## 2019-06-20 NOTE — Progress Notes (Signed)
Spoke with pt's daughter, Nat Math, and updated her to pt's current goals and plan of care. She had some questions about next of kin and decision making. I advised her that without POA papers, she and her 2 sisters would need to agree on medical decisions in the event that pt is unable to do so. She expressed understanding.

## 2019-06-20 NOTE — Progress Notes (Signed)
PROGRESS NOTE                                                                                                                                                                                                             Patient Demographics:    Austin Howell, is a 53 y.o. male, DOB - 20-Jan-1966, LFY:[id number]  Outpatient Primary MD for the patient is System, Pcp Not In   Admit date - 06/19/2019   LOS - 1  No chief complaint on file.      Brief Narrative: Patient is a 53 y.o. male with no significant past medical history who presented with fatigue, cough, progressively worsening shortness of breath-further evaluation revealed acute hypoxic respiratory failure secondary to COVID-19 pneumonia, bilateral pulmonary embolism along with AKI.  See below for further details.   Subjective:    Austin Howell today feels much better-denies any chest pain.  No nausea or vomiting.   Assessment  & Plan :   Acute Hypoxic Resp Failure due to Covid 19 Viral pneumonia, concurrent bacterial pneumonia and pulmonary embolism: Much improved-was on 15 L of HFNC on initial presentation-he is now down to just 2 L.  Continue steroids/remdesivir for Covid pneumonitis, Rocephin/Zithromax for superimposed/concurrent bacterial infection-and full dose IV heparin for DVT/PE.  Will attempt to slowly titrate off oxygen further over the next few days.    Fever: afebrile  O2 requirements: SpO2: 93 % O2 Flow Rate (L/min): 2 L/min   COVID-19 Labs: Recent Labs    06/18/19 1815 06/19/19 1445 06/20/19 0445  DDIMER  --   --  >20.00*  FERRITIN  --  2,070*  --   LDH  --  1,025*  --   CRP 21.6* 18.2* 12.5*    No results found for: SARSCOV2NAA   COVID-19 Medications: Steroids: 11/23>> Remdesivir: 11/23>> Actemra: Not indicated given mild hypoxemia. Convalescent Plasma: Not indicated given mild hypoxemia  Other medications: Diuretics:Euvolemic-no need  for lasix Antibiotics: Rocephin 11/24>> Doxy 11/24>>  Prone/Incentive Spirometry: encouraged incentive spirometry use 3-4/hour.  DVT Prophylaxis  : Heparin infusion  Pulmonary embolism along with bilateral lower extremity DVT: Provoked by COVID-19-continue heparin infusion-switch to oral anticoagulant over the next few days.  AKI: Hemodynamically mediated-improved with supportive care.  Transaminitis: Secondary to COVID-19: Improving  Minimal troponin elevation: Secondary demand ischemia/pulmonary embolism-not consistent with ACS.  Obesity: Estimated body  mass index is 38.52 kg/m as calculated from the following:   Height as of 06/18/19: 6\' 2"  (1.88 m).   Weight as of 06/18/19: 136.1 kg.   Consults  :  None  Procedures  :  None  ABG: No results found for: PHART, PCO2ART, PO2ART, HCO3, TCO2, ACIDBASEDEF, O2SAT  Vent Settings: N/A  Condition -Stable  Family Communication  :  Daughter updated over the phone  Code Status :  Full Code  Diet :  Diet Order            DIET SOFT Room service appropriate? Yes; Fluid consistency: Thin  Diet effective now               Disposition Plan  :  Remain hospitalized  Barriers to discharge: Hypoxia requiring O2 supplementation/complete 5 days of IV Remdesivir  Antimicorbials  :    Anti-infectives (From admission, onward)   Start     Dose/Rate Route Frequency Ordered Stop   06/19/19 1800  cefTRIAXone (ROCEPHIN) 2 g in sodium chloride 0.9 % 100 mL IVPB     2 g 200 mL/hr over 30 Minutes Intravenous Every 24 hours 06/19/19 1459 06/22/19 1759   06/19/19 1600  doxycycline (VIBRAMYCIN) 100 mg in sodium chloride 0.9 % 250 mL IVPB     100 mg 125 mL/hr over 120 Minutes Intravenous Every 12 hours 06/19/19 1459     06/19/19 1600  remdesivir 100 mg in sodium chloride 0.9 % 250 mL IVPB     100 mg 500 mL/hr over 30 Minutes Intravenous Daily 06/19/19 1503 06/23/19 0959      Inpatient Medications  Scheduled Meds: . sodium chloride    Intravenous Once  . insulin aspart  0-5 Units Subcutaneous QHS  . insulin aspart  0-9 Units Subcutaneous TID WC  . methylPREDNISolone (SOLU-MEDROL) injection  80 mg Intravenous Q12H   Continuous Infusions: . cefTRIAXone (ROCEPHIN)  IV 2 g (06/19/19 1741)  . doxycycline (VIBRAMYCIN) IV 100 mg (06/20/19 0446)  . heparin 2,000 Units/hr (06/19/19 1616)  . lactated ringers 125 mL/hr at 06/19/19 2043  . remdesivir 100 mg in NS 250 mL Stopped (06/19/19 2125)   PRN Meds:.acetaminophen, albuterol, ondansetron **OR** ondansetron (ZOFRAN) IV, senna-docusate   Time Spent in minutes  25  See all Orders from today for further details  Oren Binet M.D on 06/20/2019 at 8:51 AM  To page go to [email or web address] - use universal password  [facility] -  Office  [phone]    Objective:   Vitals:   06/20/19 0138 06/20/19 0230 06/20/19 0500 06/20/19 0732  BP: (!) 124/95 121/89  (!) 137/99  Pulse: 100 100  95  Resp: 17 16  (!) 30  Temp: 98.3 F (36.8 C) 97.8 F (36.6 C) 98.6 F (37 C) 98.6 F (37 C)  TempSrc: Oral Oral Oral Oral  SpO2: 93% 94%  93%    Wt Readings from Last 3 Encounters:  06/18/19 136.1 kg  01/13/15 (!) 154.3 kg  10/16/13 (!) 151.6 kg     Intake/Output Summary (Last 24 hours) at 06/20/2019 0851 Last data filed at 06/20/2019 0700 Gross per 24 hour  Intake 3009 ml  Output 1400 ml  Net 1609 ml    Physical Exam Gen Exam:Alert awake-not in any distress HEENT:atraumatic, normocephalic Chest: B/L clear to auscultation anteriorly CVS:S1S2 regular Abdomen:soft non tender, non distended Extremities:no edema Neurology: Non focal Skin: no rash   Data Review:    CBC Recent Labs  Lab 06/18/19 1815 06/19/19 1333 06/19/19 1445  06/20/19 0445  WBC 22.0* 24.3* 25.8* 22.3*  HGB 18.4* 13.3 14.0 12.7*  HCT 55.0* 41.1 43.4 39.1  PLT 139* 197 201 229  MCV 81.6 84.0 85.3 84.6  MCH 27.3 27.2 27.5 27.5  MCHC 33.5 32.4 32.3 32.5  RDW 14.3 13.8 13.8 13.8   LYMPHSABS 2.0  --  2.6 2.0  MONOABS 1.9*  --  1.7* 1.4*  EOSABS 0.0  --  0.0 0.0  BASOSABS 0.1  --  0.1 0.1    Chemistries  Recent Labs  Lab 06/18/19 1815 06/19/19 1333 06/19/19 1445 06/20/19 0445  NA 141  --  138 139  K 3.9  --  5.1 4.5  CL 100  --  109 107  CO2 21*  --  17* 20*  GLUCOSE 205*  --  167* 185*  BUN 31*  --  31* 29*  CREATININE 2.79* 1.48* 1.49* 1.15  CALCIUM 8.5*  --  7.7* 8.1*  MG  --   --   --  2.1  AST 59*  --  106* 83*  ALT 97*  --  102* 91*  ALKPHOS 67  --  71 68  BILITOT 1.2  --  1.1 0.7   ------------------------------------------------------------------------------------------------------------------ No results for input(s): CHOL, HDL, LDLCALC, TRIG, CHOLHDL, LDLDIRECT in the last 72 hours.  Lab Results  Component Value Date   HGBA1C 6.8 (H) 06/19/2019   ------------------------------------------------------------------------------------------------------------------ No results for input(s): TSH, T4TOTAL, T3FREE, THYROIDAB in the last 72 hours.  Invalid input(s): FREET3 ------------------------------------------------------------------------------------------------------------------ Recent Labs    06/19/19 1445  FERRITIN 2,070*    Coagulation profile Recent Labs  Lab 06/18/19 1815  INR 1.3*    Recent Labs    06/20/19 0445  DDIMER >20.00*    Cardiac Enzymes No results for input(s): CKMB, TROPONINI, MYOGLOBIN in the last 168 hours.  Invalid input(s): CK ------------------------------------------------------------------------------------------------------------------    Component Value Date/Time   BNP 369.7 (H) 06/20/2019 0445    Micro Results Recent Results (from the past 240 hour(s))  Blood culture (routine x 2)     Status: None (Preliminary result)   Collection Time: 06/18/19  6:15 PM   Specimen: BLOOD  Result Value Ref Range Status   Specimen Description BLOOD BLOOD RIGHT ARM  Final   Special Requests   Final     BOTTLES DRAWN AEROBIC AND ANAEROBIC Blood Culture results may not be optimal due to an excessive volume of blood received in culture bottles   Culture   Final    NO GROWTH 2 DAYS Performed at [facility], [address] [place], [place] [address]    Report Status PENDING  Incomplete  Blood culture (routine x 2)     Status: None (Preliminary result)   Collection Time: 06/18/19  7:03 PM   Specimen: BLOOD  Result Value Ref Range Status   Specimen Description BLOOD BLOOD RIGHT HAND  Final   Special Requests   Final    BOTTLES DRAWN AEROBIC ONLY Blood Culture results may not be optimal due to an inadequate volume of blood received in culture bottles   Culture   Final    NO GROWTH 2 DAYS Performed at [facility], [address] [place], [place] [address]    Report Status PENDING  Incomplete    Radiology Reports Nm Pulmonary Perfusion  Addendum Date: 06/19/2019   ADDENDUM REPORT: 06/19/2019 12:50 ADDENDUM: Critical Value/emergent results were called by telephone at the time of interpretation on 06/19/2019 at 1233 hours to provider Dr. Larinda ButteryJessup, who verbally  acknowledged these results. Electronically Signed   By: Odessa Fleming M.D.   On: 06/19/2019 12:50   Result Date: 06/19/2019 CLINICAL DATA:  53 year old COVID-19 positive male with shortness of breath, cough, chest pain and renal insufficiency precluding CTA. Perfusion only V/Q scan requested. EXAM: NUCLEAR MEDICINE PERFUSION LUNG SCAN TECHNIQUE: Perfusion images were obtained in multiple projections after intravenous injection of radiopharmaceutical. Ventilation scans intentionally deferred if perfusion scan and chest x-ray adequate for interpretation during COVID 19 epidemic. RADIOPHARMACEUTICALS:  4.5 mCi Tc-106m MAA IV COMPARISON:  Portable chest 06/18/2019. FINDINGS: Multifocal peripheral and wedge-shaped perfusion defects in both lungs, more numerous on the left. Sizable right mid lung peripheral perfusion defect. Portable  chest yesterday demonstrated minimal streaky opacity in the left mid lung only. Lung volumes today appear stable to those yesterday. IMPRESSION: Multiple bilateral linear and wedge-shaped perfusion defects constituting High Probability Of Acute PE. Electronically Signed: By: Odessa Fleming M.D. On: 06/19/2019 12:22   Dg Chest Portable 1 View  Result Date: 06/18/2019 CLINICAL DATA:  Dyspnea, COVID-19 positive EXAM: PORTABLE CHEST 1 VIEW COMPARISON:  None. FINDINGS: Low lung volumes. Borderline mild enlargement of the cardiopericardial silhouette. Normal mediastinal contour. No pneumothorax. No pleural effusion. Hazy patchy parahilar and lower lung opacities bilaterally. IMPRESSION: Borderline mild enlargement of the cardiopericardial silhouette. Hazy patchy parahilar and lower lung opacities bilaterally, which could represent atypical pneumonia versus mild pulmonary edema. Electronically Signed   By: Delbert Phenix M.D.   On: 06/18/2019 17:10

## 2019-06-20 NOTE — Progress Notes (Signed)
ANTICOAGULATION CONSULT NOTE - Follow Up Consult  Pharmacy Consult for heparin Indication: pulmonary embolus / DVTs   No Known Allergies  Patient Measurements:   Heparin Dosing Weight: 112 kg   Vital Signs: Temp: 98.9 F (37.2 C) (11/25 1129) Temp Source: Oral (11/25 1129) BP: 115/90 (11/25 1129) Pulse Rate: 95 (11/25 1129)  Labs: Recent Labs    06/18/19 1815 06/18/19 1953 06/18/19 2146  06/19/19 1333 06/19/19 1445 06/20/19 0445 06/20/19 0515 06/20/19 1325  HGB 18.4*  --   --   --  13.3 14.0 12.7*  --   --   HCT 55.0*  --   --   --  41.1 43.4 39.1  --   --   PLT 139*  --   --   --  197 201 229  --   --   APTT 32  --   --   --   --   --   --   --   --   LABPROT 16.2*  --   --   --   --   --   --   --   --   INR 1.3*  --   --   --   --   --   --   --   --   HEPARINUNFRC  --   --   --    < > 0.48  --   --  <0.10* 0.31  CREATININE 2.79*  --   --   --  1.48* 1.49* 1.15  --   --   TROPONINIHS  --  548* 489*  --   --   --   --   --   --    < > = values in this interval not displayed.    Estimated Creatinine Clearance: 110.3 mL/min (by C-G formula based on SCr of 1.15 mg/dL).   Medications:  Medications Prior to Admission  Medication Sig Dispense Refill Last Dose  . acetaminophen (TYLENOL) 500 MG tablet Take 1,000 mg by mouth every 6 (six) hours as needed for fever.   06/18/2019  . sildenafil (VIAGRA) 25 MG tablet Take 25 mg by mouth daily as needed.   unk    Assessment: 53 YOM transferred from [facility] with acute PE. LE dopplers today showed acute bilateral DVTs. Currently on IV heparin at 2000 units/hr. Initial level is low therapeutic. H/H trending down. Plt wnl. SCr has normalized. Per RN, there are no overt s/s of bleeding   Goal of Therapy:  Heparin level 0.3-0.7 units/ml Monitor platelets by anticoagulation protocol: Yes   Plan:  -Increase IV heparin to 2050 units/hr  -F/u 6 hr HL -Monitor daily HL, CBC and s/s of bleeding   Albertina Parr, PharmD.,  BCPS Clinical Pharmacist Clinical phone for 06/20/19 until 5pm: [phone]

## 2019-06-20 NOTE — Progress Notes (Addendum)
ANTICOAGULATION CONSULT NOTE - Follow Up Consult  Pharmacy Consult for heparin Indication: pulmonary embolus / DVTs   No Known Allergies  Patient Measurements:   Heparin Dosing Weight: 112 kg   Vital Signs: Temp: 97.5 F (36.4 C) (11/25 2009) Temp Source: Oral (11/25 2009) BP: 133/79 (11/25 2009) Pulse Rate: 93 (11/25 2009)  Labs: Recent Labs    06/18/19 1815 06/18/19 1953 06/18/19 2146  06/19/19 1333 06/19/19 1445 06/20/19 0445 06/20/19 0515 06/20/19 1325 06/20/19 2200  HGB 18.4*  --   --   --  13.3 14.0 12.7*  --   --   --   HCT 55.0*  --   --   --  41.1 43.4 39.1  --   --   --   PLT 139*  --   --   --  197 201 229  --   --   --   APTT 32  --   --   --   --   --   --   --   --   --   LABPROT 16.2*  --   --   --   --   --   --   --   --   --   INR 1.3*  --   --   --   --   --   --   --   --   --   HEPARINUNFRC  --   --   --    < > 0.48  --   --  <0.10* 0.31 0.46  CREATININE 2.79*  --   --   --  1.48* 1.49* 1.15  --   --   --   TROPONINIHS  --  548* 489*  --   --   --   --   --   --   --    < > = values in this interval not displayed.    Estimated Creatinine Clearance: 110.3 mL/min (by C-G formula based on SCr of 1.15 mg/dL).    Assessment: 30 YOM transferred from [facility] with acute PE. LE dopplers today showed acute bilateral DVTs. Pharmacy consulted for IV heparin.  Heparin level therapeutic (0.46) on gtt at 2050 units/hr. No bleeding noted.  Goal of Therapy:  Heparin level 0.3-0.7 units/ml Monitor platelets by anticoagulation protocol: Yes   Plan:  -Continue IV heparin at 2050 units/hr  -Monitor daily HL, CBC and s/s of bleeding   Sherlon Handing, PharmD, BCPS Please see amion for complete clinical pharmacist phone list 06/20/2019 11:27 PM   Addendum (11/26 0640): Heparin level remains therapeutic at 0.37 (but near lower end of therapeutic) and with PE, would prefer at least mid range.  Plan: Will increase heparin slightly to 2150 units/hr F/u daily  heparin level  Sherlon Handing, PharmD, BCPS Please see amion for complete clinical pharmacist phone list 06/21/2019 6:41 AM

## 2019-06-20 NOTE — Progress Notes (Signed)
  Echocardiogram 2D Echocardiogram has been performed.  Jennette Dubin 06/20/2019, 12:06 PM

## 2019-06-21 ENCOUNTER — Encounter (HOSPITAL_COMMUNITY): Payer: Self-pay

## 2019-06-21 LAB — CBC WITH DIFFERENTIAL/PLATELET
Abs Immature Granulocytes: 1.21 10*3/uL — ABNORMAL HIGH (ref 0.00–0.07)
Basophils Absolute: 0.1 10*3/uL (ref 0.0–0.1)
Basophils Relative: 0 %
Eosinophils Absolute: 0 10*3/uL (ref 0.0–0.5)
Eosinophils Relative: 0 %
HCT: 40.6 % (ref 39.0–52.0)
Hemoglobin: 13 g/dL (ref 13.0–17.0)
Immature Granulocytes: 5 %
Lymphocytes Relative: 9 %
Lymphs Abs: 2.3 10*3/uL (ref 0.7–4.0)
MCH: 27.3 pg (ref 26.0–34.0)
MCHC: 32 g/dL (ref 30.0–36.0)
MCV: 85.3 fL (ref 80.0–100.0)
Monocytes Absolute: 1.7 10*3/uL — ABNORMAL HIGH (ref 0.1–1.0)
Monocytes Relative: 7 %
Neutro Abs: 19.7 10*3/uL — ABNORMAL HIGH (ref 1.7–7.7)
Neutrophils Relative %: 79 %
Platelets: 271 10*3/uL (ref 150–400)
RBC: 4.76 MIL/uL (ref 4.22–5.81)
RDW: 13.7 % (ref 11.5–15.5)
WBC: 25 10*3/uL — ABNORMAL HIGH (ref 4.0–10.5)
nRBC: 0.1 % (ref 0.0–0.2)

## 2019-06-21 LAB — D-DIMER, QUANTITATIVE: D-Dimer, Quant: >20 ug/mL-FEU — ABNORMAL HIGH (ref 0.00–0.50)

## 2019-06-21 LAB — COMPREHENSIVE METABOLIC PANEL
ALT: 78 U/L — ABNORMAL HIGH (ref 0–44)
AST: 56 U/L — ABNORMAL HIGH (ref 15–41)
Albumin: 2.8 g/dL — ABNORMAL LOW (ref 3.5–5.0)
Alkaline Phosphatase: 67 U/L (ref 38–126)
Anion gap: 11 (ref 5–15)
BUN: 31 mg/dL — ABNORMAL HIGH (ref 6–20)
CO2: 21 mmol/L — ABNORMAL LOW (ref 22–32)
Calcium: 8.2 mg/dL — ABNORMAL LOW (ref 8.9–10.3)
Chloride: 108 mmol/L (ref 98–111)
Creatinine, Ser: 1.05 mg/dL (ref 0.61–1.24)
GFR calc Af Amer: >60 mL/min (ref >60)
GFR calc non Af Amer: >60 mL/min (ref >60)
Glucose, Bld: 183 mg/dL — ABNORMAL HIGH (ref 70–99)
Potassium: 4.4 mmol/L (ref 3.5–5.1)
Sodium: 140 mmol/L (ref 135–145)
Total Bilirubin: 0.8 mg/dL (ref 0.3–1.2)
Total Protein: 6.7 g/dL (ref 6.5–8.1)

## 2019-06-21 LAB — GLUCOSE, CAPILLARY
Glucose-Capillary: 190 mg/dL — ABNORMAL HIGH (ref 70–99)
Glucose-Capillary: 263 mg/dL — ABNORMAL HIGH (ref 70–99)
Glucose-Capillary: 237 mg/dL — ABNORMAL HIGH (ref 70–99)
Glucose-Capillary: 195 mg/dL — ABNORMAL HIGH (ref 70–99)

## 2019-06-21 LAB — HEPARIN LEVEL (UNFRACTIONATED): Heparin Unfractionated: 0.37 IU/mL (ref 0.30–0.70)

## 2019-06-21 LAB — C-REACTIVE PROTEIN: CRP: 7.2 mg/dL — ABNORMAL HIGH (ref <1.0)

## 2019-06-21 LAB — MAGNESIUM: Magnesium: 2.2 mg/dL (ref 1.7–2.4)

## 2019-06-21 LAB — BRAIN NATRIURETIC PEPTIDE: B Natriuretic Peptide: 284.8 pg/mL — ABNORMAL HIGH (ref 0.0–100.0)

## 2019-06-21 MED ORDER — RIVAROXABAN 20 MG PO TABS: 20.0000 mg | ORAL_TABLET | Freq: Every day | ORAL | Status: DC

## 2019-06-21 MED ORDER — METHYLPREDNISOLONE SODIUM SUCC 125 MG IJ SOLR
60.0000 mg | Freq: Two times a day (BID) | INTRAMUSCULAR | Status: DC
  Administered 2019-06-21 – 2019-06-22 (×2): 60 mg via INTRAVENOUS
  Filled 2019-06-21 (×2): qty 2

## 2019-06-21 MED ORDER — RIVAROXABAN 15 MG PO TABS
15.0000 mg | ORAL_TABLET | Freq: Two times a day (BID) | ORAL | Status: DC
  Administered 2019-06-21 – 2019-06-22 (×2): 15 mg via ORAL
  Filled 2019-06-21 (×4): qty 1

## 2019-06-21 NOTE — Discharge Instructions (Addendum)
Person Under Monitoring Name: Austin Howell  Location: [address] [place] [id number]   Infection Prevention Recommendations for Individuals Confirmed to have, or Being Evaluated for, 2019 Novel Coronavirus (COVID-19) Infection Who Receive Care at Home  Individuals who are confirmed to have, or are being evaluated for, COVID-19 should follow the prevention steps below until a healthcare provider or local or state health department says they can return to normal activities.  Stay home except to get medical care You should restrict activities outside your home, except for getting medical care. Do not go to work, school, or public areas, and do not use public transportation or taxis.  Call ahead before visiting your doctor Before your medical appointment, call the healthcare provider and tell them that you have, or are being evaluated for, COVID-19 infection. This will help the healthcare providers office take steps to keep other people from getting infected. Ask your healthcare provider to call the local or state health department.  Monitor your symptoms Seek prompt medical attention if your illness is worsening (e.g., difficulty breathing). Before going to your medical appointment, call the healthcare provider and tell them that you have, or are being evaluated for, COVID-19 infection. Ask your healthcare provider to call the local or state health department.  Wear a facemask You should wear a facemask that covers your nose and mouth when you are in the same room with other people and when you visit a healthcare provider. People who live with or visit you should also wear a facemask while they are in the same room with you.  Separate yourself from other people in your home As much as possible, you should stay in a different room from other people in your home. Also, you should use a separate bathroom, if available.  Avoid sharing household items You should not share  dishes, drinking glasses, cups, eating utensils, towels, bedding, or other items with other people in your home. After using these items, you should wash them thoroughly with soap and water.  Cover your coughs and sneezes Cover your mouth and nose with a tissue when you cough or sneeze, or you can cough or sneeze into your sleeve. Throw used tissues in a lined trash can, and immediately wash your hands with soap and water for at least 20 seconds or use an alcohol-based hand rub.  Wash your [employer]hands Wash your hands often and thoroughly with soap and water for at least 20 seconds. You can use an alcohol-based hand sanitizer if soap and water are not available and if your hands are not visibly dirty. Avoid touching your eyes, nose, and mouth with unwashed hands.   Prevention Steps for Caregivers and Household Members of Individuals Confirmed to have, or Being Evaluated for, COVID-19 Infection Being Cared for in the Home  If you live with, or provide care at home for, a person confirmed to have, or being evaluated for, COVID-19 infection please follow these guidelines to prevent infection:  Follow healthcare providers instructions Make sure that you understand and can help the patient follow any healthcare provider instructions for all care.  Provide for the patients basic needs You should help the patient with basic needs in the home and provide support for getting groceries, prescriptions, and other personal needs.  Monitor the patients symptoms If they are getting sicker, call his or her medical provider and tell them that the patient has, or is being evaluated for, COVID-19 infection. This will help the healthcare providers office  take steps to keep other people from getting infected. Ask the healthcare provider to call the local or state health department.  Limit the number of people who have contact with the patient  If possible, have only one caregiver for the patient.  Other  household members should stay in another home or place of residence. If this is not possible, they should stay  in another room, or be separated from the patient as much as possible. Use a separate bathroom, if available.  Restrict visitors who do not have an essential need to be in the home.  Keep older adults, very young children, and other sick people away from the patient Keep older adults, very young children, and those who have compromised immune systems or chronic health conditions away from the patient. This includes people with chronic heart, lung, or kidney conditions, diabetes, and cancer.  Ensure good ventilation Make sure that shared spaces in the home have good air flow, such as from an air conditioner or an opened window, weather permitting.  Wash your hands often  Wash your hands often and thoroughly with soap and water for at least 20 seconds. You can use an alcohol based hand sanitizer if soap and water are not available and if your hands are not visibly dirty.  Avoid touching your eyes, nose, and mouth with unwashed hands.  Use disposable paper towels to dry your hands. If not available, use dedicated cloth towels and replace them when they become wet.  Wear a facemask and gloves  Wear a disposable facemask at all times in the room and gloves when you touch or have contact with the patients blood, body fluids, and/or secretions or excretions, such as sweat, saliva, sputum, nasal mucus, vomit, urine, or feces.  Ensure the mask fits over your nose and mouth tightly, and do not touch it during use.  Throw out disposable facemasks and gloves after using them. Do not reuse.  Wash your hands immediately after removing your facemask and gloves.  If your personal clothing becomes contaminated, carefully remove clothing and launder. Wash your hands after handling contaminated clothing.  Place all used disposable facemasks, gloves, and other waste in a lined container before  disposing them with other household waste.  Remove gloves and wash your hands immediately after handling these items.  Do not share dishes, glasses, or other household items with the patient  Avoid sharing household items. You should not share dishes, drinking glasses, cups, eating utensils, towels, bedding, or other items with a patient who is confirmed to have, or being evaluated for, COVID-19 infection.  After the person uses these items, you should wash them thoroughly with soap and water.  Wash laundry thoroughly  Immediately remove and wash clothes or bedding that have blood, body fluids, and/or secretions or excretions, such as sweat, saliva, sputum, nasal mucus, vomit, urine, or feces, on them.  Wear gloves when handling laundry from the patient.  Read and follow directions on labels of laundry or clothing items and detergent. In general, wash and dry with the warmest temperatures recommended on the label.  Clean all areas the individual has used often  Clean all touchable surfaces, such as counters, tabletops, doorknobs, bathroom fixtures, toilets, phones, keyboards, tablets, and bedside tables, every day. Also, clean any surfaces that may have blood, body fluids, and/or secretions or excretions on them.  Wear gloves when cleaning surfaces the patient has come in contact with.  Use a diluted bleach solution (e.g., dilute bleach with 1 part  bleach and 10 parts water) or a household disinfectant with a label that says EPA-registered for coronaviruses. To make a bleach solution at home, add 1 tablespoon of bleach to 1 quart (4 cups) of water. For a larger supply, add  cup of bleach to 1 gallon (16 cups) of water.  Read labels of cleaning products and follow recommendations provided on product labels. Labels contain instructions for safe and effective use of the cleaning product including precautions you should take when applying the product, such as wearing gloves or eye protection  and making sure you have good ventilation during use of the product.  Remove gloves and wash hands immediately after cleaning.  Monitor yourself for signs and symptoms of illness Caregivers and household members are considered close contacts, should monitor their health, and will be asked to limit movement outside of the home to the extent possible. Follow the monitoring steps for close contacts listed on the symptom monitoring form.   ? If you have additional questions, contact your local health department or call the epidemiologist on call at [phone] (available 24/7). ? This guidance is subject to change. For the most up-to-date guidance from [employer], please refer to their website: YouBlogs.pl     Information on my medicine - XARELTO (rivaroxaban)  This medication education was reviewed with me or my healthcare representative as part of my discharge preparation.  The pharmacist that spoke with me during my hospital stay was:  Lavenia Atlas, [place]? Xarelto was prescribed to treat blood clots that may have been found in the veins of your legs (deep vein thrombosis) or in your lungs (pulmonary embolism) and to reduce the risk of them occurring again.  What do you need to know about Xarelto? The starting dose is one 15 mg tablet taken TWICE daily with food for the FIRST 21 DAYS then on (enter date)  07/13/2019  the dose is changed to one 20 mg tablet taken ONCE A DAY with your evening meal.  DO NOT stop taking Xarelto without talking to the health care provider who prescribed the medication.  Refill your prescription for 20 mg tablets before you run out.  After discharge, you should have regular check-up appointments with your healthcare provider that is prescribing your Xarelto.  In the future your dose may need to be changed if your kidney function changes by a significant  amount.  What do you do if you miss a dose? If you are taking Xarelto TWICE DAILY and you miss a dose, take it as soon as you remember. You may take two 15 mg tablets (total 30 mg) at the same time then resume your regularly scheduled 15 mg twice daily the next day.  If you are taking Xarelto ONCE DAILY and you miss a dose, take it as soon as you remember on the same day then continue your regularly scheduled once daily regimen the next day. Do not take two doses of Xarelto at the same time.   Important Safety Information Xarelto is a blood thinner medicine that can cause bleeding. You should call your healthcare provider right away if you experience any of the following: ? Bleeding from an injury or your nose that does not stop. ? Unusual colored urine (red or dark brown) or unusual colored stools (red or black). ? Unusual bruising for unknown reasons. ? A serious fall or if you hit your head (even if there is no bleeding).  Some medicines may interact  with Xarelto and might increase your risk of bleeding while on Xarelto. To help avoid this, consult your healthcare provider or pharmacist prior to using any new prescription or non-prescription medications, including herbals, vitamins, non-steroidal anti-inflammatory drugs (NSAIDs) and supplements.  This website has more information on Xarelto: VisitDestination.com.br.

## 2019-06-21 NOTE — Progress Notes (Signed)
Pt walked to the bathroom without oxygen. While standing at the sinking and washing up, his o2 sats dropped to 88%.

## 2019-06-21 NOTE — Progress Notes (Addendum)
PROGRESS NOTE                                                                                                                                                                                                             Patient Demographics:    Austin Howell, is a 53 y.o. male, DOB - [birth year], NGE:[id number]  Outpatient Primary MD for the patient is System, Pcp Not In   Admit date - 06/19/2019   LOS - 2  No chief complaint on file.      Brief Narrative: Patient is a 53 y.o. male with no significant past medical history who presented with fatigue, cough, progressively worsening shortness of breath-further evaluation revealed acute hypoxic respiratory failure secondary to COVID-19 pneumonia, bilateral pulmonary embolism along with AKI.  See below for further details.   Subjective:    Austin Howell today denies any chest pain or shortness of breath.  He was lying comfortably in bed.  He was on 2 L of oxygen.     Assessment  & Plan :   Acute Hypoxic Resp Failure due to Covid 19 Viral pneumonia, concurrent bacterial pneumonia and pulmonary embolism: Continues to improve-initially on 15 L of HFNC-now just on 2 L.  Will ask nursing staff to see if we can continue to titrate him off oxygen.  May require home O2 for a few weeks on discharge.  For now-continue with steroids (but taper)/remdesivir for Covid pneumonia, Rocephin/Zithromax for concurrent bacterial pneumonia.  Will switch from IV heparin to Xarelto (discussed choices of anticoagulation with patient-he chose Xarelto) .assess for home O2 requirement prior to discharge.    Fever: afebrile  O2 requirements: SpO2: 95 % O2 Flow Rate (L/min): 2 L/min   COVID-19 Labs: Recent Labs    06/19/19 1445 06/20/19 0445 06/21/19 0405  DDIMER  --  >20.00* >20.00*  FERRITIN 2,070*  --   --   LDH 1,025*  --   --   CRP 18.2* 12.5* 7.2*    No results found for: SARSCOV2NAA    COVID-19 Medications: Steroids: 11/23>> Remdesivir: 11/23>> Actemra: Not indicated given mild hypoxemia. Convalescent Plasma: Not indicated given mild hypoxemia  Other medications: Diuretics:Euvolemic-no need for lasix Antibiotics: Rocephin 11/24>> Doxy 11/24>>  Prone/Incentive Spirometry: encouraged incentive spirometry use 3-4/hour.  DVT Prophylaxis  : Heparin infusion  Pulmonary embolism along with bilateral lower extremity  DVT: Provoked by COVID-19-managed with IV heparin-we will transition to Xarelto.  AKI: Hemodynamically mediated-improved with supportive care.  Transaminitis: Secondary to COVID-19: Improving  Minimal troponin elevation: Secondary demand ischemia/pulmonary embolism-not consistent with ACS.  Obesity: Estimated body mass index is 38.52 kg/m as calculated from the following:   Height as of 06/18/19: 6\' 2"  (1.88 m).   Weight as of 06/18/19: 136.1 kg.   Consults  :  None  Procedures  :  None  ABG: No results found for: PHART, PCO2ART, PO2ART, HCO3, TCO2, ACIDBASEDEF, O2SAT  Vent Settings: N/A  Condition -Stable  Family Communication  : Called daughter 11/26  Code Status :  Full Code  Diet :  Diet Order            DIET SOFT Room service appropriate? Yes; Fluid consistency: Thin  Diet effective now               Disposition Plan  :  Remain hospitalized-likely d/c 11/27  Barriers to discharge: Hypoxia requiring O2 supplementation/complete 5 days of IV Remdesivir  Antimicorbials  :    Anti-infectives (From admission, onward)   Start     Dose/Rate Route Frequency Ordered Stop   06/19/19 1800  cefTRIAXone (ROCEPHIN) 2 g in sodium chloride 0.9 % 100 mL IVPB     2 g 200 mL/hr over 30 Minutes Intravenous Every 24 hours 06/19/19 1459 06/22/19 1759   06/19/19 1600  doxycycline (VIBRAMYCIN) 100 mg in sodium chloride 0.9 % 250 mL IVPB     100 mg 125 mL/hr over 120 Minutes Intravenous Every 12 hours 06/19/19 1459     06/19/19 1600   remdesivir 100 mg in sodium chloride 0.9 % 250 mL IVPB     100 mg 500 mL/hr over 30 Minutes Intravenous Daily 06/19/19 1503 06/23/19 0959      Inpatient Medications  Scheduled Meds:  sodium chloride   Intravenous Once   insulin aspart  0-5 Units Subcutaneous QHS   insulin aspart  0-9 Units Subcutaneous TID WC   methylPREDNISolone (SOLU-MEDROL) injection  80 mg Intravenous Q12H   Continuous Infusions:  cefTRIAXone (ROCEPHIN)  IV 2 g (06/20/19 1932)   doxycycline (VIBRAMYCIN) IV 100 mg (06/21/19 16100609)   heparin 2,150 Units/hr (06/21/19 1111)   remdesivir 100 mg in NS 250 mL 100 mg (06/21/19 1033)   PRN Meds:.acetaminophen, albuterol, ondansetron **OR** ondansetron (ZOFRAN) IV, senna-docusate   Time Spent in minutes  25  See all Orders from today for further details  Jeoffrey MassedShanker Adelie Croswell M.D on 06/21/2019 at 2:17 PM  To page go to [email or web address] - use universal password  [facility] -  Office  [phone]    Objective:   Vitals:   06/20/19 2333 06/21/19 0438 06/21/19 0728 06/21/19 1231  BP: (!) 130/96 (!) 134/97 125/73 (!) 133/99  Pulse: 88 91 84 93  Resp: (!) 21 (!) 22 19 20   Temp: 97.8 F (36.6 C) (!) 97.5 F (36.4 C) (!) 97.4 F (36.3 C)   TempSrc: Oral Oral Axillary   SpO2: 94% 94% 92% 95%    Wt Readings from Last 3 Encounters:  06/18/19 136.1 kg  01/13/15 (!) 154.3 kg  10/16/13 (!) 151.6 kg     Intake/Output Summary (Last 24 hours) at 06/21/2019 1417 Last data filed at 06/21/2019 1010 Gross per 24 hour  Intake 1350.16 ml  Output 1925 ml  Net -574.84 ml    Physical Exam Gen Exam:Alert awake-not in any distress HEENT:atraumatic, normocephalic Chest: B/L clear to auscultation anteriorly CVS:S1S2 regular  Abdomen:soft non tender, non distended Extremities:no edema Neurology: Non focal Skin: no rash   Data Review:    CBC Recent Labs  Lab 06/18/19 1815 06/19/19 1333 06/19/19 1445 06/20/19 0445 06/21/19 0405  WBC 22.0* 24.3*  25.8* 22.3* 25.0*  HGB 18.4* 13.3 14.0 12.7* 13.0  HCT 55.0* 41.1 43.4 39.1 40.6  PLT 139* 197 201 229 271  MCV 81.6 84.0 85.3 84.6 85.3  MCH 27.3 27.2 27.5 27.5 27.3  MCHC 33.5 32.4 32.3 32.5 32.0  RDW 14.3 13.8 13.8 13.8 13.7  LYMPHSABS 2.0  --  2.6 2.0 2.3  MONOABS 1.9*  --  1.7* 1.4* 1.7*  EOSABS 0.0  --  0.0 0.0 0.0  BASOSABS 0.1  --  0.1 0.1 0.1    Chemistries  Recent Labs  Lab 06/18/19 1815 06/19/19 1333 06/19/19 1445 06/20/19 0445 06/21/19 0405  NA 141  --  138 139 140  K 3.9  --  5.1 4.5 4.4  CL 100  --  109 107 108  CO2 21*  --  17* 20* 21*  GLUCOSE 205*  --  167* 185* 183*  BUN 31*  --  31* 29* 31*  CREATININE 2.79* 1.48* 1.49* 1.15 1.05  CALCIUM 8.5*  --  7.7* 8.1* 8.2*  MG  --   --   --  2.1 2.2  AST 59*  --  106* 83* 56*  ALT 97*  --  102* 91* 78*  ALKPHOS 67  --  71 68 67  BILITOT 1.2  --  1.1 0.7 0.8   ------------------------------------------------------------------------------------------------------------------ No results for input(s): CHOL, HDL, LDLCALC, TRIG, CHOLHDL, LDLDIRECT in the last 72 hours.  Lab Results  Component Value Date   HGBA1C 6.8 (H) 06/19/2019   ------------------------------------------------------------------------------------------------------------------ No results for input(s): TSH, T4TOTAL, T3FREE, THYROIDAB in the last 72 hours.  Invalid input(s): FREET3 ------------------------------------------------------------------------------------------------------------------ Recent Labs    06/19/19 1445  FERRITIN 2,070*    Coagulation profile Recent Labs  Lab 06/18/19 1815  INR 1.3*    Recent Labs    06/20/19 0445 06/21/19 0405  DDIMER >20.00* >20.00*    Cardiac Enzymes No results for input(s): CKMB, TROPONINI, MYOGLOBIN in the last 168 hours.  Invalid input(s): CK ------------------------------------------------------------------------------------------------------------------    Component Value  Date/Time   BNP 284.8 (H) 06/21/2019 0405    Micro Results Recent Results (from the past 240 hour(s))  Blood culture (routine x 2)     Status: None (Preliminary result)   Collection Time: 06/18/19  6:15 PM   Specimen: BLOOD  Result Value Ref Range Status   Specimen Description BLOOD BLOOD RIGHT ARM  Final   Special Requests   Final    BOTTLES DRAWN AEROBIC AND ANAEROBIC Blood Culture results may not be optimal due to an excessive volume of blood received in culture bottles   Culture   Final    NO GROWTH 3 DAYS Performed at [facility], [address] [place], [place] [address]    Report Status PENDING  Incomplete  Blood culture (routine x 2)     Status: None (Preliminary result)   Collection Time: 06/18/19  7:03 PM   Specimen: BLOOD  Result Value Ref Range Status   Specimen Description BLOOD BLOOD RIGHT HAND  Final   Special Requests   Final    BOTTLES DRAWN AEROBIC ONLY Blood Culture results may not be optimal due to an inadequate volume of blood received in culture bottles   Culture   Final    NO GROWTH 3  DAYS Performed at [facility], [address] [place], [place] [address]    Report Status PENDING  Incomplete  Culture, blood (Routine X 2) w Reflex to ID Panel     Status: None (Preliminary result)   Collection Time: 06/19/19  2:45 PM   Specimen: BLOOD RIGHT HAND  Result Value Ref Range Status   Specimen Description   Final    BLOOD RIGHT HAND Performed at [facility], [address], [place], [place] [address]    Special Requests   Final    BOTTLES DRAWN AEROBIC ONLY Blood Culture results may not be optimal due to an inadequate volume of blood received in culture bottles Performed at [facility], [address], [place], [place] [address]    Culture   Final    NO GROWTH 2 DAYS Performed at [facility], [address], [place], [place] [address]    Report Status PENDING  Incomplete  Culture,  Urine     Status: None   Collection Time: 06/19/19  4:40 PM   Specimen: Urine, Catheterized  Result Value Ref Range Status   Specimen Description   Final    URINE, CATHETERIZED Performed at [facility], [address] [place], [place] [address]    Special Requests   Final    NONE Performed at [facility], [address], [place], [place] [address]    Culture   Final    NO GROWTH Performed at [facility], [address] [place], [place] [address]    Report Status 06/20/2019 FINAL  Final    Radiology Reports Nm Pulmonary Perfusion  Addendum Date: 06/19/2019   ADDENDUM REPORT: 06/19/2019 12:50 ADDENDUM: Critical Value/emergent results were called by telephone at the time of interpretation on 06/19/2019 at 1233 hours to provider Dr. Larinda Buttery, who verbally acknowledged these results. Electronically Signed   By: Odessa Fleming M.D.   On: 06/19/2019 12:50   Result Date: 06/19/2019 CLINICAL DATA:  53 year old COVID-19 positive male with shortness of breath, cough, chest pain and renal insufficiency precluding CTA. Perfusion only V/Q scan requested. EXAM: NUCLEAR MEDICINE PERFUSION LUNG SCAN TECHNIQUE: Perfusion images were obtained in multiple projections after intravenous injection of radiopharmaceutical. Ventilation scans intentionally deferred if perfusion scan and chest x-ray adequate for interpretation during COVID 19 epidemic. RADIOPHARMACEUTICALS:  4.5 mCi Tc-73m MAA IV COMPARISON:  Portable chest 06/18/2019. FINDINGS: Multifocal peripheral and wedge-shaped perfusion defects in both lungs, more numerous on the left. Sizable right mid lung peripheral perfusion defect. Portable chest yesterday demonstrated minimal streaky opacity in the left mid lung only. Lung volumes today appear stable to those yesterday. IMPRESSION: Multiple bilateral linear and wedge-shaped perfusion defects constituting High Probability Of Acute PE. Electronically Signed: By: Odessa Fleming M.D. On: 06/19/2019 12:22   Dg Chest Portable 1 View  Result Date: 06/18/2019 CLINICAL DATA:  Dyspnea, COVID-19 positive EXAM: PORTABLE CHEST 1 VIEW COMPARISON:  None. FINDINGS: Low lung volumes. Borderline mild enlargement of the cardiopericardial silhouette. Normal mediastinal contour. No pneumothorax. No pleural effusion. Hazy patchy parahilar and lower lung opacities bilaterally. IMPRESSION: Borderline mild enlargement of the cardiopericardial silhouette. Hazy patchy parahilar and lower lung opacities bilaterally, which could represent atypical pneumonia versus mild pulmonary edema. Electronically Signed   By: Delbert Phenix M.D.   On: 06/18/2019 17:10   Leg [place] Cone  Result Date: 06/20/2019  Lower Venous Study Indications: Pulmonary embolism.  Limitations: Poor ultrasound/tissue interface and body habitus. Comparison  Study: no prior Performing Technologist: Blanch Media RVS  Examination Guidelines: A complete evaluation includes B-mode imaging, spectral Doppler, color Doppler, and power Doppler as needed of all accessible portions of each vessel. Bilateral testing is considered an integral part of a complete examination. Limited examinations for reoccurring indications may be performed as noted.  +---------+---------------+---------+-----------+----------+--------------+  RIGHT     Compressibility Phasicity Spontaneity Properties Thrombus Aging  +---------+---------------+---------+-----------+----------+--------------+  CFV       Full            Yes       Yes                                    +---------+---------------+---------+-----------+----------+--------------+  SFJ       Full                                                             +---------+---------------+---------+-----------+----------+--------------+  FV Prox   None                                                             +---------+---------------+---------+-----------+----------+--------------+  FV Mid    None                                                              +---------+---------------+---------+-----------+----------+--------------+  FV Distal                                                  Not visualized  +---------+---------------+---------+-----------+----------+--------------+  PFV       None                                                             +---------+---------------+---------+-----------+----------+--------------+  POP       None            No        No                                     +---------+---------------+---------+-----------+----------+--------------+  PTV       None                                                             +---------+---------------+---------+-----------+----------+--------------+  PERO                                                       Not visualized  +---------+---------------+---------+-----------+----------+--------------+   +---------+---------------+---------+-----------+----------+--------------+  LEFT      Compressibility Phasicity Spontaneity Properties Thrombus Aging  +---------+---------------+---------+-----------+----------+--------------+  CFV       Full            Yes       Yes                                    +---------+---------------+---------+-----------+----------+--------------+  SFJ       Full                                                             +---------+---------------+---------+-----------+----------+--------------+  FV Prox   Full                                                             +---------+---------------+---------+-----------+----------+--------------+  FV Mid    Full                                                             +---------+---------------+---------+-----------+----------+--------------+  FV Distal Full                                                             +---------+---------------+---------+-----------+----------+--------------+  PFV       Full                                                              +---------+---------------+---------+-----------+----------+--------------+  POP       None            No        No                                     +---------+---------------+---------+-----------+----------+--------------+  PTV  Not visualized  +---------+---------------+---------+-----------+----------+--------------+  PERO                                                       Not visualized  +---------+---------------+---------+-----------+----------+--------------+     Summary: Right: Findings consistent with acute deep vein thrombosis involving the right femoral vein, right proximal profunda vein, right popliteal vein, and right posterior tibial veins. No cystic structure found in the popliteal fossa. Left: Findings consistent with acute deep vein thrombosis involving the left popliteal vein. No cystic structure found in the popliteal fossa.  *See table(s) above for measurements and observations. Electronically signed by Servando Snare MD on 06/20/2019 at 1:53:00 PM.    Final

## 2019-06-21 NOTE — Progress Notes (Signed)
ANTICOAGULATION CONSULT NOTE - Follow Up Consult  Pharmacy Consult for heparin > Xarelto  Indication: pulmonary embolus / DVTs   No Known Allergies  Patient Measurements:   Heparin Dosing Weight: 112 kg   Vital Signs: Temp: 97.4 F (36.3 C) (11/26 0728) Temp Source: Axillary (11/26 0728) BP: 133/99 (11/26 1231) Pulse Rate: 93 (11/26 1231)  Labs: Recent Labs    06/18/19 1815 06/18/19 1953 06/18/19 2146  06/19/19 1445 06/20/19 0445  06/20/19 1325 06/20/19 2200 06/21/19 0405  HGB 18.4*  --   --    < > 14.0 12.7*  --   --   --  13.0  HCT 55.0*  --   --    < > 43.4 39.1  --   --   --  40.6  PLT 139*  --   --    < > 201 229  --   --   --  271  APTT 32  --   --   --   --   --   --   --   --   --   LABPROT 16.2*  --   --   --   --   --   --   --   --   --   INR 1.3*  --   --   --   --   --   --   --   --   --   HEPARINUNFRC  --   --   --    < >  --   --    < > 0.31 0.46 0.37  CREATININE 2.79*  --   --    < > 1.49* 1.15  --   --   --  1.05  TROPONINIHS  --  548* 489*  --   --   --   --   --   --   --    < > = values in this interval not displayed.    Estimated Creatinine Clearance: 120.8 mL/min (by C-G formula based on SCr of 1.05 mg/dL).    Assessment: 38 YOM transferred from [facility] with acute PE. LE dopplers today showed acute bilateral DVTs. Currently on IV heparin for acute PE and DVTs. H/H and Plt wnl. SCr wnl   Goal of Therapy:  Heparin level 0.3-0.7 units/ml Monitor platelets by anticoagulation protocol: Yes   Plan:  -Stop IV heparin at 5 PM today -Start Xarelto 15 mg twice x 21 days, then transition to Xarelto 20 mg daily. First dose today at 5 PM -Monitor renal fx, CBC and s/s of bleeding -Will educate patient prior to discharge   Albertina Parr, PharmD., BCPS Clinical Pharmacist Clinical phone for 06/21/19 until 5pm: [phone]

## 2019-06-22 DIAGNOSIS — I2609 Other pulmonary embolism with acute cor pulmonale: Secondary | ICD-10-CM

## 2019-06-22 LAB — CBC WITH DIFFERENTIAL/PLATELET
Abs Immature Granulocytes: 1.83 10*3/uL — ABNORMAL HIGH (ref 0.00–0.07)
Basophils Absolute: 0.2 10*3/uL — ABNORMAL HIGH (ref 0.0–0.1)
Basophils Relative: 1 %
Eosinophils Absolute: 0 10*3/uL (ref 0.0–0.5)
Eosinophils Relative: 0 %
HCT: 41.5 % (ref 39.0–52.0)
Hemoglobin: 13.2 g/dL (ref 13.0–17.0)
Immature Granulocytes: 7 %
Lymphocytes Relative: 8 %
Lymphs Abs: 2.2 10*3/uL (ref 0.7–4.0)
MCH: 27.3 pg (ref 26.0–34.0)
MCHC: 31.8 g/dL (ref 30.0–36.0)
MCV: 85.9 fL (ref 80.0–100.0)
Monocytes Absolute: 1.8 10*3/uL — ABNORMAL HIGH (ref 0.1–1.0)
Monocytes Relative: 7 %
Neutro Abs: 20 10*3/uL — ABNORMAL HIGH (ref 1.7–7.7)
Neutrophils Relative %: 77 %
Platelets: 285 10*3/uL (ref 150–400)
RBC: 4.83 MIL/uL (ref 4.22–5.81)
RDW: 13.8 % (ref 11.5–15.5)
WBC: 26 10*3/uL — ABNORMAL HIGH (ref 4.0–10.5)
nRBC: 0.3 % — ABNORMAL HIGH (ref 0.0–0.2)

## 2019-06-22 LAB — C-REACTIVE PROTEIN: CRP: 4 mg/dL — ABNORMAL HIGH (ref <1.0)

## 2019-06-22 LAB — COMPREHENSIVE METABOLIC PANEL
ALT: 71 U/L — ABNORMAL HIGH (ref 0–44)
AST: 42 U/L — ABNORMAL HIGH (ref 15–41)
Albumin: 2.7 g/dL — ABNORMAL LOW (ref 3.5–5.0)
Alkaline Phosphatase: 65 U/L (ref 38–126)
Anion gap: 12 (ref 5–15)
BUN: 31 mg/dL — ABNORMAL HIGH (ref 6–20)
CO2: 19 mmol/L — ABNORMAL LOW (ref 22–32)
Calcium: 8.2 mg/dL — ABNORMAL LOW (ref 8.9–10.3)
Chloride: 108 mmol/L (ref 98–111)
Creatinine, Ser: 1.05 mg/dL (ref 0.61–1.24)
GFR calc Af Amer: >60 mL/min (ref >60)
GFR calc non Af Amer: >60 mL/min (ref >60)
Glucose, Bld: 192 mg/dL — ABNORMAL HIGH (ref 70–99)
Potassium: 4.9 mmol/L (ref 3.5–5.1)
Sodium: 139 mmol/L (ref 135–145)
Total Bilirubin: 0.8 mg/dL (ref 0.3–1.2)
Total Protein: 6.2 g/dL — ABNORMAL LOW (ref 6.5–8.1)

## 2019-06-22 LAB — D-DIMER, QUANTITATIVE: D-Dimer, Quant: 19.02 ug/mL-FEU — ABNORMAL HIGH (ref 0.00–0.50)

## 2019-06-22 LAB — MAGNESIUM: Magnesium: 2.1 mg/dL (ref 1.7–2.4)

## 2019-06-22 LAB — GLUCOSE, CAPILLARY
Glucose-Capillary: 198 mg/dL — ABNORMAL HIGH (ref 70–99)
Glucose-Capillary: 243 mg/dL — ABNORMAL HIGH (ref 70–99)

## 2019-06-22 LAB — BRAIN NATRIURETIC PEPTIDE: B Natriuretic Peptide: 185.4 pg/mL — ABNORMAL HIGH (ref 0.0–100.0)

## 2019-06-22 MED ORDER — AMOXICILLIN-POT CLAVULANATE 875-125 MG PO TABS: 1.0000 | ORAL_TABLET | Freq: Two times a day (BID) | ORAL | 0 refills | Status: AC

## 2019-06-22 MED ORDER — PREDNISONE 10 MG PO TABS: ORAL_TABLET | ORAL | 0 refills | Status: DC

## 2019-06-22 MED ORDER — ALBUTEROL SULFATE HFA 108 (90 BASE) MCG/ACT IN AERS: 2.0000 | INHALATION_SPRAY | Freq: Four times a day (QID) | RESPIRATORY_TRACT | 0 refills | Status: AC | PRN

## 2019-06-22 MED ORDER — RIVAROXABAN (XARELTO) VTE STARTER PACK (15 & 20 MG): ORAL_TABLET | ORAL | 0 refills | Status: DC

## 2019-06-22 MED ORDER — RIVAROXABAN 20 MG PO TABS: 20.0000 mg | ORAL_TABLET | Freq: Every day | ORAL | 0 refills | Status: AC

## 2019-06-22 NOTE — Discharge Summary (Addendum)
PATIENT DETAILS Name: Austin Howell Age: 53 y.o. Sex: male Date of Birth: 1965/12/06 MRN: [id number]. Admitting Physician: Haydee Monica, MD WUJ:[id number], [facility]  Admit Date: 06/19/2019 Discharge date: 06/22/2019  Recommendations for Outpatient Follow-up:  1. Follow up with PCP in 1-2 weeks 2. Please obtain CMP/CBC in one week 3. Repeat Chest Xray in 4-6 week 4. Consider referral to hematology before discontinuation of anticoagulation 5. Consider outpatient evaluation by vascular surgery 6. PCP to assess O2 requirement at next visit  Admitted From:  Home  Disposition: Home   Home Health: No  Equipment/Devices: Oxygen 2L  Discharge Condition: Stable  CODE STATUS: FULL CODE  Diet recommendation:  Diet Order            Diet - low sodium heart healthy        DIET SOFT Room service appropriate? Yes; Fluid consistency: Thin  Diet effective now               Brief Summary: See H&P, Labs, Consult and Test reports for all details in brief, Patient is a 53 y.o. male with no significant past medical history who presented with fatigue, cough, progressively worsening shortness of breath-further evaluation revealed acute hypoxic respiratory failure secondary to COVID-19 pneumonia, bilateral pulmonary embolism along with AKI.  See below for further details.  Brief Hospital Course: Acute Hypoxic Resp Failure due to Covid 19 Viral pneumonia, concurrent bacterial pneumonia and pulmonary embolism:Significantly improved-treated with steroids /remdesivir for Covid pneumonia, Rocephin/Zithromax for concurrent bacterial pneumonia and IV heparin>>xarelto for PE.He is now on room air at rest, but does desaturates to the high 80's with ambulation, hence will be discharged on Home O2, suspect he will require it for a few weeks. Per patient he is not SOB-and thinks that he can manage easily at home with support of family.  COVID-19 Labs:  Recent Labs    06/19/19 1445  06/20/19 0445 06/21/19 0405 06/22/19 0330  DDIMER  --  >20.00* >20.00* 19.02*  FERRITIN 2,070*  --   --   --   LDH 1,025*  --   --   --   CRP 18.2* 12.5* 7.2* 4.0*    SARSCOV2NAA +ve at [facility] on 11/14 (care everywhere)   COVID-19 Medications: Steroids: 11/23>> Remdesivir: 11/23>>11/27 Actemra: Not indicated Convalescent Plasma: Not indicated  Pulmonary embolism along with bilateral lower extremity DVT: Provoked by COVID-19-managed with IV heparin-and subsequently transitioned to Xarelto.Consider outpatient referral to Hematology prior to discontinuation of anticoagulation, consider outpatient referral to vascular surgery as well.Note although patient has DVT in B/L lower extremities, he does not appear to have extensive swelling of lower extremities, per patient his lower extremities are at baseline (obese)  AKI: Hemodynamically mediated-improved with supportive care.  Transaminitis: Secondary to COVID-19: Improving  Minimal troponin elevation: Secondary demand ischemia/pulmonary embolism-not consistent with ACS.  Morbid Obesity: Estimated body mass index is 38.52 kg/m as calculated from the following:   Height as of 06/18/19: 6\' 2"  (1.88 m).   Weight as of 06/18/19: 136.1 kg.    Procedures/Studies: 11/25>>TTE  1. Left ventricular ejection fraction, by visual estimation, is 60 to 65%. The left ventricle has normal function. There is mildly increased left ventricular hypertrophy.  2. Left ventricular diastolic parameters are indeterminate.  3. Global right ventricle has mildly reduced systolic function.The right ventricular size is mildly enlarged. No increase in right ventricular wall thickness.  4. Moderately elevated pulmonary artery systolic pressure.  5. Left atrial size was normal.  6. Right atrial size  was severely dilated.  7. The mitral valve is normal in structure. No evidence of mitral valve regurgitation. No evidence of mitral stenosis.  8. The tricuspid valve  is normal in structure. Tricuspid valve regurgitation is mild.  9. The aortic valve is normal in structure. Aortic valve regurgitation is not visualized. No evidence of aortic valve sclerosis or stenosis. 10. The pulmonic valve was normal in structure. Pulmonic valve regurgitation is mild. 11. The inferior vena cava is dilated in size with <50% respiratory variability, suggesting right atrial pressure of 15 mmHg. 12. The interatrial septum was not well visualized.  Discharge Diagnoses:  Active Problems:   Acute hypoxemic respiratory failure [facility])   Discharge Instructions:    Person Under Monitoring Name: Rodrigus Kilker  Location: [address] [place] [address]   Infection Prevention Recommendations for Individuals Confirmed to have, or Being Evaluated for, 2019 Novel Coronavirus (COVID-19) Infection Who Receive Care at Home  Individuals who are confirmed to have, or are being evaluated for, COVID-19 should follow the prevention steps below until a healthcare provider or local or state health department says they can return to normal activities.  Stay home except to get medical care You should restrict activities outside your home, except for getting medical care. Do not go to work, school, or public areas, and do not use public transportation or taxis.  Call ahead before visiting your doctor Before your medical appointment, call the healthcare provider and tell them that you have, or are being evaluated for, COVID-19 infection. This will help the healthcare provider's office take steps to keep other people from getting infected. Ask your healthcare provider to call the local or state health department.  Monitor your symptoms Seek prompt medical attention if your illness is worsening (e.g., difficulty breathing). Before going to your medical appointment, call the healthcare provider and tell them that you have, or are being evaluated for, COVID-19 infection. Ask your  healthcare provider to call the local or state health department.  Wear a facemask You should wear a facemask that covers your nose and mouth when you are in the same room with other people and when you visit a healthcare provider. People who live with or visit you should also wear a facemask while they are in the same room with you.  Separate yourself from other people in your home As much as possible, you should stay in a different room from other people in your home. Also, you should use a separate bathroom, if available.  Avoid sharing household items You should not share dishes, drinking glasses, cups, eating utensils, towels, bedding, or other items with other people in your home. After using these items, you should wash them thoroughly with soap and water.  Cover your coughs and sneezes Cover your mouth and nose with a tissue when you cough or sneeze, or you can cough or sneeze into your sleeve. Throw used tissues in a lined trash can, and immediately wash your hands with soap and water for at least 20 seconds or use an alcohol-based hand rub.  Wash your [employer] your hands often and thoroughly with soap and water for at least 20 seconds. You can use an alcohol-based hand sanitizer if soap and water are not available and if your hands are not visibly dirty. Avoid touching your eyes, nose, and mouth with unwashed hands.   Prevention Steps for Caregivers and Household Members of Individuals Confirmed to have, or Being Evaluated for, COVID-19 Infection Being Cared for in  the Home  If you live with, or provide care at home for, a person confirmed to have, or being evaluated for, COVID-19 infection please follow these guidelines to prevent infection:  Follow healthcare provider's instructions Make sure that you understand and can help the patient follow any healthcare provider instructions for all care.  Provide for the patient's basic needs You should help the patient with  basic needs in the home and provide support for getting groceries, prescriptions, and other personal needs.  Monitor the patient's symptoms If they are getting sicker, call his or her medical provider and tell them that the patient has, or is being evaluated for, COVID-19 infection. This will help the healthcare provider's office take steps to keep other people from getting infected. Ask the healthcare provider to call the local or state health department.  Limit the number of people who have contact with the patient  If possible, have only one caregiver for the patient.  Other household members should stay in another home or place of residence. If this is not possible, they should stay  in another room, or be separated from the patient as much as possible. Use a separate bathroom, if available.  Restrict visitors who do not have an essential need to be in the home.  Keep older adults, very young children, and other sick people away from the patient Keep older adults, very young children, and those who have compromised immune systems or chronic health conditions away from the patient. This includes people with chronic heart, lung, or kidney conditions, diabetes, and cancer.  Ensure good ventilation Make sure that shared spaces in the home have good air flow, such as from an air conditioner or an opened window, weather permitting.  Wash your hands often  Wash your hands often and thoroughly with soap and water for at least 20 seconds. You can use an alcohol based hand sanitizer if soap and water are not available and if your hands are not visibly dirty.  Avoid touching your eyes, nose, and mouth with unwashed hands.  Use disposable paper towels to dry your hands. If not available, use dedicated cloth towels and replace them when they become wet.  Wear a facemask and gloves  Wear a disposable facemask at all times in the room and gloves when you touch or have contact with the  patient's blood, body fluids, and/or secretions or excretions, such as sweat, saliva, sputum, nasal mucus, vomit, urine, or feces.  Ensure the mask fits over your nose and mouth tightly, and do not touch it during use.  Throw out disposable facemasks and gloves after using them. Do not reuse.  Wash your hands immediately after removing your facemask and gloves.  If your personal clothing becomes contaminated, carefully remove clothing and launder. Wash your hands after handling contaminated clothing.  Place all used disposable facemasks, gloves, and other waste in a lined container before disposing them with other household waste.  Remove gloves and wash your hands immediately after handling these items.  Do not share dishes, glasses, or other household items with the patient  Avoid sharing household items. You should not share dishes, drinking glasses, cups, eating utensils, towels, bedding, or other items with a patient who is confirmed to have, or being evaluated for, COVID-19 infection.  After the person uses these items, you should wash them thoroughly with soap and water.  Wash laundry thoroughly  Immediately remove and wash clothes or bedding that have blood, body fluids, and/or secretions or excretions,  such as sweat, saliva, sputum, nasal mucus, vomit, urine, or feces, on them.  Wear gloves when handling laundry from the patient.  Read and follow directions on labels of laundry or clothing items and detergent. In general, wash and dry with the warmest temperatures recommended on the label.  Clean all areas the individual has used often  Clean all touchable surfaces, such as counters, tabletops, doorknobs, bathroom fixtures, toilets, phones, keyboards, tablets, and bedside tables, every day. Also, clean any surfaces that may have blood, body fluids, and/or secretions or excretions on them.  Wear gloves when cleaning surfaces the patient has come in contact with.  Use a diluted  bleach solution (e.g., dilute bleach with 1 part bleach and 10 parts water) or a household disinfectant with a label that says EPA-registered for coronaviruses. To make a bleach solution at home, add 1 tablespoon of bleach to 1 quart (4 cups) of water. For a larger supply, add  cup of bleach to 1 gallon (16 cups) of water.  Read labels of cleaning products and follow recommendations provided on product labels. Labels contain instructions for safe and effective use of the cleaning product including precautions you should take when applying the product, such as wearing gloves or eye protection and making sure you have good ventilation during use of the product.  Remove gloves and wash hands immediately after cleaning.  Monitor yourself for signs and symptoms of illness Caregivers and household members are considered close contacts, should monitor their health, and will be asked to limit movement outside of the home to the extent possible. Follow the monitoring steps for close contacts listed on the symptom monitoring form.   ? If you have additional questions, contact your local health department or call the epidemiologist on call at [phone] (available 24/7). ? This guidance is subject to change. For the most up-to-date guidance from CDC, please refer to their website: [employer]Met[employer].hu   Activity:  As tolerated with Full fall precautions use walker/cane & assistance as needed  Discharge Instructions    Call MD for:  difficulty breathing, headache or visual disturbances   Complete by: As directed    Call MD for:  persistant nausea and vomiting   Complete by: As directed    Diet - low sodium heart healthy   Complete by: As directed    Discharge instructions   Complete by: As directed    Follow with Primary MD  [place], [facility] in 1-2 weeks  PLEASE NOTE THE DIFFERENCE IN XARELTO DOSING FOR THE FIRST 3 WEEKS 15 MG TWICE  DAILY-FOLLOWED BY 20 MG ONCE DAILY  Please get a complete blood count and chemistry panel checked by your Primary MD at your next visit, and again as instructed by your Primary MD.  Get Medicines reviewed and adjusted: Please take all your medications with you for your next visit with your Primary MD  Laboratory/radiological data: Please request your Primary MD to go over all hospital tests and procedure/radiological results at the follow up, please ask your Primary MD to get all Hospital records sent to his/her office.  In some cases, they will be blood work, cultures and biopsy results pending at the time of your discharge. Please request that your primary care M.D. follows up on these results.  Also Note the following: If you experience worsening of your admission symptoms, develop shortness of breath, life threatening emergency, suicidal or homicidal thoughts you must seek medical attention immediately by calling 911 or calling your MD immediately  if symptoms less severe.  You must read complete instructions/literature along with all the possible adverse reactions/side effects for all the Medicines you take and that have been prescribed to you. Take any new Medicines after you have completely understood and accpet all the possible adverse reactions/side effects.   Do not drive when taking Pain medications or sleeping medications (Benzodaizepines)  Do not take more than prescribed Pain, Sleep and Anxiety Medications. It is not advisable to combine anxiety,sleep and pain medications without talking with your primary care practitioner  Special Instructions: If you have smoked or chewed Tobacco  in the last 2 yrs please stop smoking, stop any regular Alcohol  and or any Recreational drug use.  Wear Seat belts while driving.  Please note: You were cared for by a hospitalist during your hospital stay. Once you are discharged, your primary care physician will handle any further medical  issues. Please note that NO REFILLS for any discharge medications will be authorized once you are discharged, as it is imperative that you return to your primary care physician (or establish a relationship with a primary care physician if you do not have one) for your post hospital discharge needs so that they can reassess your need for medications and monitor your lab values.   ISOLATION/QUARANTINE FOR 3 WEEKS FROM 06/09/19   Increase activity slowly   Complete by: As directed      Allergies as of 06/22/2019   No Known Allergies     Medication List    TAKE these medications   acetaminophen 500 MG tablet Commonly known as: TYLENOL Take 1,000 mg by mouth every 6 (six) hours as needed for fever.   albuterol 108 (90 Base) MCG/ACT inhaler Commonly known as: VENTOLIN HFA Inhale 2 puffs into the lungs every 6 (six) hours as needed for wheezing or shortness of breath.   amoxicillin-clavulanate 875-125 MG tablet Commonly known as: Augmentin Take 1 tablet by mouth every 12 (twelve) hours for 2 days.   predniSONE 10 MG tablet Commonly known as: DELTASONE Take 40 mg daily for 1 day, 30 mg daily for 1 day, 20 mg daily for 1 days,10 mg daily for 1 day, then stop   Rivaroxaban 15 & 20 MG Tbpk Follow package directions: Take one  tablet by mouth twice a day. On day 22(i.e 07/13/19 ), switch to one  tablet once a day. Take with food.   rivaroxaban 20 MG Tabs tablet Commonly known as: XARELTO Take 1 tablet (20 mg total) by mouth daily with supper. START TAKING ONLY AFTER YOU FINISH THE STARTER PACK Start taking on: July 13, 2019   sildenafil 25 MG tablet Commonly known as: VIAGRA Take 25 mg by mouth daily as needed.            Durable Medical Equipment  (From admission, onward)         Start     Ordered   06/21/19 1559  For home use only DME oxygen  Once    Question Answer Comment  Length of Need 6 Months   Mode or (Route) Nasal cannula   Liters per Minute 2    Frequency Continuous (stationary and portable oxygen unit needed)   Oxygen conserving device Yes   Oxygen delivery system Gas      06/21/19 1558         Follow-up Information    [place], [facility]. Schedule an appointment as soon as possible for a visit in 1 week(s).   Contact information:  [address] [place] [address] [phone]        Maeola Harman, MD. Schedule an appointment as soon as possible for a visit in 2 week(s).   Specialties: Vascular Surgery, Cardiology Why: for lower extremity DVT Contact information: [address] [place] [address] [phone]          No Known Allergies  Consultations:   None   Other Procedures/Studies: Nm Pulmonary Perfusion  Addendum Date: 06/19/2019   ADDENDUM REPORT: 06/19/2019 12:50 ADDENDUM: Critical Value/emergent results were called by telephone at the time of interpretation on 06/19/2019 at 1233 hours to provider Dr. Larinda Buttery, who verbally acknowledged these results. Electronically Signed   By: Odessa Fleming M.D.   On: 06/19/2019 12:50   Result Date: 06/19/2019 CLINICAL DATA:  53 year old COVID-19 positive male with shortness of breath, cough, chest pain and renal insufficiency precluding CTA. Perfusion only V/Q scan requested. EXAM: NUCLEAR MEDICINE PERFUSION LUNG SCAN TECHNIQUE: Perfusion images were obtained in multiple projections after intravenous injection of radiopharmaceutical. Ventilation scans intentionally deferred if perfusion scan and chest x-ray adequate for interpretation during COVID 19 epidemic. RADIOPHARMACEUTICALS:  4.5 mCi Tc-70m MAA IV COMPARISON:  Portable chest 06/18/2019. FINDINGS: Multifocal peripheral and wedge-shaped perfusion defects in both lungs, more numerous on the left. Sizable right mid lung peripheral perfusion defect. Portable chest yesterday demonstrated minimal streaky opacity in the left mid lung only. Lung volumes today appear stable to those yesterday. IMPRESSION:  Multiple bilateral linear and wedge-shaped perfusion defects constituting High Probability Of Acute PE. Electronically Signed: By: Odessa Fleming M.D. On: 06/19/2019 12:22   Dg Chest Portable 1 View  Result Date: 06/18/2019 CLINICAL DATA:  Dyspnea, COVID-19 positive EXAM: PORTABLE CHEST 1 VIEW COMPARISON:  None. FINDINGS: Low lung volumes. Borderline mild enlargement of the cardiopericardial silhouette. Normal mediastinal contour. No pneumothorax. No pleural effusion. Hazy patchy parahilar and lower lung opacities bilaterally. IMPRESSION: Borderline mild enlargement of the cardiopericardial silhouette. Hazy patchy parahilar and lower lung opacities bilaterally, which could represent atypical pneumonia versus mild pulmonary edema. Electronically Signed   By: Delbert Phenix M.D.   On: 06/18/2019 17:10   Leg [place] [facility]  Result Date: 06/20/2019  Lower Venous Study Indications: Pulmonary embolism.  Limitations: Poor ultrasound/tissue interface and body habitus. Comparison Study: no prior Performing Technologist: Blanch Media RVS  Examination Guidelines: A complete evaluation includes B-mode imaging, spectral Doppler, color Doppler, and power Doppler as needed of all accessible portions of each vessel. Bilateral testing is considered an integral part of a complete examination. Limited examinations for reoccurring indications may be performed as noted.  +---------+---------------+---------+-----------+----------+--------------+ RIGHT    CompressibilityPhasicitySpontaneityPropertiesThrombus Aging +---------+---------------+---------+-----------+----------+--------------+ CFV      Full           Yes      Yes                                 +---------+---------------+---------+-----------+----------+--------------+ SFJ      Full                                                        +---------+---------------+---------+-----------+----------+--------------+ FV Prox  None                                                         +---------+---------------+---------+-----------+----------+--------------+  FV Mid   None                                                        +---------+---------------+---------+-----------+----------+--------------+ FV Distal                                             Not visualized +---------+---------------+---------+-----------+----------+--------------+ PFV      None                                                        +---------+---------------+---------+-----------+----------+--------------+ POP      None           No       No                                  +---------+---------------+---------+-----------+----------+--------------+ PTV      None                                                        +---------+---------------+---------+-----------+----------+--------------+ PERO                                                  Not visualized +---------+---------------+---------+-----------+----------+--------------+   +---------+---------------+---------+-----------+----------+--------------+ LEFT     CompressibilityPhasicitySpontaneityPropertiesThrombus Aging +---------+---------------+---------+-----------+----------+--------------+ CFV      Full           Yes      Yes                                 +---------+---------------+---------+-----------+----------+--------------+ SFJ      Full                                                        +---------+---------------+---------+-----------+----------+--------------+ FV Prox  Full                                                        +---------+---------------+---------+-----------+----------+--------------+ FV Mid   Full                                                        +---------+---------------+---------+-----------+----------+--------------+  FV DistalFull                                                         +---------+---------------+---------+-----------+----------+--------------+ PFV      Full                                                        +---------+---------------+---------+-----------+----------+--------------+ POP      None           No       No                                  +---------+---------------+---------+-----------+----------+--------------+ PTV                                                   Not visualized +---------+---------------+---------+-----------+----------+--------------+ PERO                                                  Not visualized +---------+---------------+---------+-----------+----------+--------------+     Summary: Right: Findings consistent with acute deep vein thrombosis involving the right femoral vein, right proximal profunda vein, right popliteal vein, and right posterior tibial veins. No cystic structure found in the popliteal fossa. Left: Findings consistent with acute deep vein thrombosis involving the left popliteal vein. No cystic structure found in the popliteal fossa.  *See table(s) above for measurements and observations. Electronically signed by Lemar Livings MD on 06/20/2019 at 1:53:00 PM.    Final      TODAY-DAY OF DISCHARGE:  Subjective:   Limmie Patricia today has no headache,no chest abdominal pain,no new weakness tingling or numbness, feels much better wants to go home today.   Objective:   Blood pressure (!) 137/98, pulse 81, temperature 98.6 F (37 C), temperature source Oral, resp. rate 20, height  (1.88 m), weight 136.1 kg, SpO2 93 %.  Intake/Output Summary (Last 24 hours) at 06/22/2019 0935 Last data filed at 06/22/2019 0841 Gross per 24 hour  Intake 2377 ml  Output 1800 ml  Net 577 ml   Filed Weights   06/21/19 1940  Weight: 136.1 kg    Exam: Awake Alert, Oriented *3, No new F.N deficits, Normal affect [place].AT,PERRAL Supple Neck,No JVD, No cervical lymphadenopathy appriciated.   Symmetrical Chest wall movement, Good air movement bilaterally, CTAB RRR,No Gallops,Rubs or new Murmurs, No Parasternal Heave +ve B.Sounds, Abd Soft, Non tender, No organomegaly appriciated, No rebound -guarding or rigidity. No Cyanosis, Clubbing or edema, No new Rash or bruise   PERTINENT RADIOLOGIC STUDIES: Nm Pulmonary Perfusion  Addendum Date: 06/19/2019   ADDENDUM REPORT: 06/19/2019 12:50 ADDENDUM: Critical Value/emergent results were called by telephone at the time of interpretation on 06/19/2019 at 1233 hours to provider Dr. Larinda Buttery, who verbally acknowledged these results. Electronically Signed   By: Althea Grimmer.D.  On: 06/19/2019 12:50   Result Date: 06/19/2019 CLINICAL DATA:  53 year old COVID-19 positive male with shortness of breath, cough, chest pain and renal insufficiency precluding CTA. Perfusion only V/Q scan requested. EXAM: NUCLEAR MEDICINE PERFUSION LUNG SCAN TECHNIQUE: Perfusion images were obtained in multiple projections after intravenous injection of radiopharmaceutical. Ventilation scans intentionally deferred if perfusion scan and chest x-ray adequate for interpretation during COVID 19 epidemic. RADIOPHARMACEUTICALS:  4.5 mCi Tc-8180m MAA IV COMPARISON:  Portable chest 06/18/2019. FINDINGS: Multifocal peripheral and wedge-shaped perfusion defects in both lungs, more numerous on the left. Sizable right mid lung peripheral perfusion defect. Portable chest yesterday demonstrated minimal streaky opacity in the left mid lung only. Lung volumes today appear stable to those yesterday. IMPRESSION: Multiple bilateral linear and wedge-shaped perfusion defects constituting High Probability Of Acute PE. Electronically Signed: By: Odessa FlemingH  Hall M.D. On: 06/19/2019 12:22   Dg Chest Portable 1 View  Result Date: 06/18/2019 CLINICAL DATA:  Dyspnea, COVID-19 positive EXAM: PORTABLE CHEST 1 VIEW COMPARISON:  None. FINDINGS: Low lung volumes. Borderline mild enlargement of the cardiopericardial  silhouette. Normal mediastinal contour. No pneumothorax. No pleural effusion. Hazy patchy parahilar and lower lung opacities bilaterally. IMPRESSION: Borderline mild enlargement of the cardiopericardial silhouette. Hazy patchy parahilar and lower lung opacities bilaterally, which could represent atypical pneumonia versus mild pulmonary edema. Electronically Signed   By: Delbert PhenixJason A Poff M.D.   On: 06/18/2019 17:10   Leg [facility]  Result Date: 06/20/2019  Lower Venous Study Indications: Pulmonary embolism.  Limitations: Poor ultrasound/tissue interface and body habitus. Comparison Study: no prior Performing Technologist: Blanch MediaMegan Riddle RVS  Examination Guidelines: A complete evaluation includes B-mode imaging, spectral Doppler, color Doppler, and power Doppler as needed of all accessible portions of each vessel. Bilateral testing is considered an integral part of a complete examination. Limited examinations for reoccurring indications may be performed as noted.  +---------+---------------+---------+-----------+----------+--------------+ RIGHT    CompressibilityPhasicitySpontaneityPropertiesThrombus Aging +---------+---------------+---------+-----------+----------+--------------+ CFV      Full           Yes      Yes                                 +---------+---------------+---------+-----------+----------+--------------+ SFJ      Full                                                        +---------+---------------+---------+-----------+----------+--------------+ FV Prox  None                                                        +---------+---------------+---------+-----------+----------+--------------+ FV Mid   None                                                        +---------+---------------+---------+-----------+----------+--------------+ FV Distal  Not visualized  +---------+---------------+---------+-----------+----------+--------------+ PFV      None                                                        +---------+---------------+---------+-----------+----------+--------------+ POP      None           No       No                                  +---------+---------------+---------+-----------+----------+--------------+ PTV      None                                                        +---------+---------------+---------+-----------+----------+--------------+ PERO                                                  Not visualized +---------+---------------+---------+-----------+----------+--------------+   +---------+---------------+---------+-----------+----------+--------------+ LEFT     CompressibilityPhasicitySpontaneityPropertiesThrombus Aging +---------+---------------+---------+-----------+----------+--------------+ CFV      Full           Yes      Yes                                 +---------+---------------+---------+-----------+----------+--------------+ SFJ      Full                                                        +---------+---------------+---------+-----------+----------+--------------+ FV Prox  Full                                                        +---------+---------------+---------+-----------+----------+--------------+ FV Mid   Full                                                        +---------+---------------+---------+-----------+----------+--------------+ FV DistalFull                                                        +---------+---------------+---------+-----------+----------+--------------+ PFV      Full                                                        +---------+---------------+---------+-----------+----------+--------------+  POP      None           No       No                                   +---------+---------------+---------+-----------+----------+--------------+ PTV                                                   Not visualized +---------+---------------+---------+-----------+----------+--------------+ PERO                                                  Not visualized +---------+---------------+---------+-----------+----------+--------------+     Summary: Right: Findings consistent with acute deep vein thrombosis involving the right femoral vein, right proximal profunda vein, right popliteal vein, and right posterior tibial veins. No cystic structure found in the popliteal fossa. Left: Findings consistent with acute deep vein thrombosis involving the left popliteal vein. No cystic structure found in the popliteal fossa.  *See table(s) above for measurements and observations. Electronically signed by Servando Snare MD on 06/20/2019 at 1:53:00 PM.    Final      PERTINENT LAB RESULTS: CBC: Recent Labs    06/21/19 0405 06/22/19 0330  WBC 25.0* 26.0*  HGB 13.0 13.2  HCT 40.6 41.5  PLT 271 285   CMET CMP     Component Value Date/Time   NA 139 06/22/2019 0330   K 4.9 06/22/2019 0330   CL 108 06/22/2019 0330   CO2 19 (L) 06/22/2019 0330   GLUCOSE 192 (H) 06/22/2019 0330   BUN 31 (H) 06/22/2019 0330   CREATININE 1.05 06/22/2019 0330   CALCIUM 8.2 (L) 06/22/2019 0330   PROT 6.2 (L) 06/22/2019 0330   ALBUMIN 2.7 (L) 06/22/2019 0330   AST 42 (H) 06/22/2019 0330   ALT 71 (H) 06/22/2019 0330   ALKPHOS 65 06/22/2019 0330   BILITOT 0.8 06/22/2019 0330   GFRNONAA >60 06/22/2019 0330   GFRAA >60 06/22/2019 0330    GFR Estimated Creatinine Clearance: 120.8 mL/min (by C-G formula based on SCr of 1.05 mg/dL). No results for input(s): LIPASE, AMYLASE in the last 72 hours. No results for input(s): CKTOTAL, CKMB, CKMBINDEX, TROPONINI in the last 72 hours. Invalid input(s): POCBNP Recent Labs    06/21/19 0405 06/22/19 0330  DDIMER >20.00* 19.02*   Recent Labs     06/19/19 1445  HGBA1C 6.8*   No results for input(s): CHOL, HDL, LDLCALC, TRIG, CHOLHDL, LDLDIRECT in the last 72 hours. No results for input(s): TSH, T4TOTAL, T3FREE, THYROIDAB in the last 72 hours.  Invalid input(s): FREET3 Recent Labs    06/19/19 1445  FERRITIN 2,070*   Coags: No results for input(s): INR in the last 72 hours.  Invalid input(s): PT Microbiology: Recent Results (from the past 240 hour(s))  Blood culture (routine x 2)     Status: None (Preliminary result)   Collection Time: 06/18/19  6:15 PM   Specimen: BLOOD  Result Value Ref Range Status   Specimen Description BLOOD BLOOD RIGHT ARM  Final   Special Requests   Final    BOTTLES DRAWN AEROBIC AND ANAEROBIC Blood Culture results may not  be optimal due to an excessive volume of blood received in culture bottles   Culture   Final    NO GROWTH 4 DAYS Performed at [facility], [address] [place], [place] [address]    Report Status PENDING  Incomplete  Blood culture (routine x 2)     Status: None (Preliminary result)   Collection Time: 06/18/19  7:03 PM   Specimen: BLOOD  Result Value Ref Range Status   Specimen Description BLOOD BLOOD RIGHT HAND  Final   Special Requests   Final    BOTTLES DRAWN AEROBIC ONLY Blood Culture results may not be optimal due to an inadequate volume of blood received in culture bottles   Culture   Final    NO GROWTH 4 DAYS Performed at [facility], [address] [place], [place] [address]    Report Status PENDING  Incomplete  Culture, blood (Routine X 2) w Reflex to ID Panel     Status: None (Preliminary result)   Collection Time: 06/19/19  2:45 PM   Specimen: BLOOD RIGHT HAND  Result Value Ref Range Status   Specimen Description   Final    BLOOD RIGHT HAND Performed at [facility], [address] [place], [place] [address]    Special Requests   Final    BOTTLES DRAWN AEROBIC ONLY Blood Culture results may not be optimal  due to an inadequate volume of blood received in culture bottles Performed at [facility], [address], [place], [place] [address]    Culture   Final    NO GROWTH 2 DAYS Performed at [facility], [address], [place], [place] [address]    Report Status PENDING  Incomplete  Culture, Urine     Status: None   Collection Time: 06/19/19  4:40 PM   Specimen: Urine, Catheterized  Result Value Ref Range Status   Specimen Description   Final    URINE, CATHETERIZED Performed at [facility], [address], [place], [place] [address]    Special Requests   Final    NONE Performed at [facility] [address] [place], [place] [address]    Culture   Final    NO GROWTH Performed at [facility], [address] [place], [place] [address]    Report Status 06/20/2019 FINAL  Final    FURTHER DISCHARGE INSTRUCTIONS:  Get Medicines reviewed and adjusted: Please take all your medications with you for your next visit with your Primary MD  Laboratory/radiological data: Please request your Primary MD to go over all hospital tests and procedure/radiological results at the follow up, please ask your Primary MD to get all Hospital records sent to his/her office.  In some cases, they will be blood work, cultures and biopsy results pending at the time of your discharge. Please request that your primary care M.D. goes through all the records of your hospital data and follows up on these results.  Also Note the following: If you experience worsening of your admission symptoms, develop shortness of breath, life threatening emergency, suicidal or homicidal thoughts you must seek medical attention immediately by calling 911 or calling your MD immediately  if symptoms less severe.  You must read complete instructions/literature along with all the possible adverse reactions/side effects for all the Medicines you take and that have  been prescribed to you. Take any new Medicines after you have completely understood and accpet all the  possible adverse reactions/side effects.   Do not drive when taking Pain medications or sleeping medications (Benzodaizepines)  Do not take more than prescribed Pain, Sleep and Anxiety Medications. It is not advisable to combine anxiety,sleep and pain medications without talking with your primary care practitioner  Special Instructions: If you have smoked or chewed Tobacco  in the last 2 yrs please stop smoking, stop any regular Alcohol  and or any Recreational drug use.  Wear Seat belts while driving.  Please note: You were cared for by a hospitalist during your hospital stay. Once you are discharged, your primary care physician will handle any further medical issues. Please note that NO REFILLS for any discharge medications will be authorized once you are discharged, as it is imperative that you return to your primary care physician (or establish a relationship with a primary care physician if you do not have one) for your post hospital discharge needs so that they can reassess your need for medications and monitor your lab values.  Total Time spent coordinating discharge including counseling, education and face to face time equals 35 minutes.  SignedJeoffrey Massed 06/22/2019 9:35 AM

## 2019-06-22 NOTE — Progress Notes (Signed)
Discharge to home with significant other, O2 tank available for transport. O2 delivery to home set up for use. Discharge instructions and place for pharmacy pick up discussed. Understands continued quarantine for covid 19, paper signed and placed in patient chart. TELE and PIV removed.

## 2019-06-23 LAB — CULTURE, BLOOD (ROUTINE X 2)
Culture: NO GROWTH
Culture: NO GROWTH

## 2019-06-24 LAB — CULTURE, BLOOD (ROUTINE X 2): Culture: NO GROWTH

## 2019-07-16 NOTE — Progress Notes (Signed)
[facility]  [address], [address] [place], [place] [address] Phone: [phone]  Fax: [phone]   Clinic Day:  07/18/2019  Referring physician: Titus MouldWhite, Austin Burney*  Chief Complaint: Austin Howell is a 53 y.o. male with a pulmonary thromboembolismwho is referred in consultation by Austin MouldElizabeth Burney White, NP for assessment and management.   HPI:  The patient was diagnosed with COVID-19 on 06/09/2019.  He presented with fatigue, cough, and feeling feverish and cold at work.  He went to urgent care in [place], [place] where he tested positive for COVID-19.   He was admitted to [facility] in [place] from 06/18/2019 - 06/22/2019.  He presented to [facility] with a 5 day history of progressive shortness of breath, dry cough, and mild fatigue.  He presented to [facility] ER with acute hypoxic respiratory failure, dehydration, AKI, bilateral PE on VQ scan, and non-ACS pattern troponin rise.  D-dimers > 10,000 on 06/18/2019.  Ferritin was 2,070 on 06/19/2019.   He was started on heparin drip and [facility].  VQ scan on 06/19/2019 revealed multiple bilateral linear and wedge-shaped perfusion defects constituting High Probability of acute pulmonary embolism.  Echo on 06/20/2019 revealed an EF of 60-65%. There was mildly increased LVH.  The right atrium was severely dilated and the right ventricle mildly enlarged.   He was treated with solumedrol and remdesivir.  He received IV doxycycline then ceftriaxone and azithromycin for concurrent bacterial pneumonia.  Heparin was switched to Xarelto.  He was discharged on oxygen as he desaturated to the high 80s with ambulation.  CBC followed: 06/18/2019: Hematocrit 55.0, hemoglobin 18.4, MCV 81.6, platelets 139,000, WBC 22,000 (ANC 17,300), absolute monocytes 1,900. INR 1.3. 06/19/2019: Hematocrit 43.4, hemoglobin 14.0, MCV 85.3, platelets 201,000, WBC 25,800 (ANC 20,900), absolute monocytes 1,700.    06/20/2019: Hematocrit 39.1, hemoglobin 12.7, MCV 84.6, platelets 229,000, WBC 22,300 (ANC 18,400), absolute monocytes 1,400. 06/21/2019: Hematocrit 40.6, hemoglobin 13.0, MCV 85.3, platelets 271,000, WBC 25,000 (ANC 19,700), absolute monocytes 1,700. 06/22/2019: Hematocrit 41.5, hemoglobin 13.2, MCV 85.9, platelets 285,000, WBC 26,000 (ANC 20,000), absolute monocytes 1,800.   CMP followed: 06/18/2019: BUN 31, creatinine 2.79, calcium 8.5, albumin 3.1, AST 59, ALT 97. 06/19/2019: BUN 31, creatinine 1.49, calcium 7.7, albumin 2.8, AST 106, ALT 102. 06/20/2019: BUN 29, creatinine 1.15, calcium 8.1, albumin 2.8, AST 83, ALT 91. 06/21/2019: BUN 31, creatinine 1.05, calcium 8.2, albumin 2.8, AST 56, ALT 78.  06/22/2019: BUN 31, creatinine 1.05, calcium 8.2, albumin 2.7, AST 42, ALT 71, total protein 6.2.   He has a history of type 2 diabetes.  Symptomatically, he feels great. After discharge from the hospital, he used 1 liter/min of oxygen x 4 days. He no longer needs oxygen. He denies any shortness of breath. He continues to have rhinorrhea in the mornings. He feels fatigued if he becomes too active. His reports his right leg feels heavier than the left leg.   He has had a weight loss of 10-20 pounds while sick with COVID-19.  He was told he had a clot in his right leg but did not have an ultrasound. He plans to see someone in vascular surgery for an ultrasound.  His BP is 144/101 (recheck 119/81) secondary to being seen in the cancer center today.   He is currently out of work secondary to recovering from COVID-19. He is happy to get plenty of rest since he works 7 days/week. He believes he contracted the virus from work where the cases are  high. He plans to go back to work in 07/2019.    Past Medical History:  Diagnosis Date  . High cholesterol   . History of blood clots   . Hypertension     History reviewed. No pertinent surgical history.  Family History  Problem Relation Age of Onset   . Cervical cancer Mother   . Lung cancer Father   . Cervical cancer Sister   He has no family history of any hematologic disorder. His mother (age 4) and sister (age 69) had cervical cancer. His father (age 84) had throat cancer secondary to smoking. His mother, father, and sister all died from cancer.    Social History:  reports that he has never smoked. He has never used smokeless tobacco. He reports that he does not drink alcohol or use drugs.  He denies any exposure to radiations of toxins. He is working 7 days/week at [employer] in [place].  At work he wears steel toe boots and walks on concrete floors. He has 3 daughters.  Patient lives in [place], [place]. The patient is alone today.  Allergies: No Known Allergies  Current Medications: Current Outpatient Medications  Medication Sig Dispense Refill  . atorvastatin (LIPITOR) 10 MG tablet Take 10 mg by mouth daily at 6 PM.     . lisinopril-hydrochlorothiazide (ZESTORETIC) 20-12.5 MG tablet Take 1 tablet by mouth daily.     . rivaroxaban (XARELTO) 20 MG TABS tablet Take 1 tablet (20 mg total) by mouth daily with supper. START TAKING ONLY AFTER YOU FINISH THE STARTER PACK 30 tablet 0  . sildenafil (VIAGRA) 25 MG tablet Take 25 mg by mouth daily as needed.    Marland Kitchen acetaminophen (TYLENOL) 500 MG tablet Take 1,000 mg by mouth every 6 (six) hours as needed for fever.    Marland Kitchen albuterol (VENTOLIN HFA) 108 (90 Base) MCG/ACT inhaler Inhale 2 puffs into the lungs every 6 (six) hours as needed for wheezing or shortness of breath. (Patient not taking: Reported on 07/18/2019) 6.7 g 0   No current facility-administered medications for this visit.    Review of Systems  Constitutional: Positive for malaise/fatigue and weight loss (10-20 pounds secondary to COVID-19). Negative for chills, diaphoresis and fever.  HENT: Negative for congestion, hearing loss, nosebleeds and sore throat.        Rhinorrhea in the mornings.  Eyes: Negative for blurred vision.   Respiratory: Negative for cough, hemoptysis, sputum production, shortness of breath and wheezing.   Cardiovascular: Negative for chest pain, palpitations and leg swelling.  Gastrointestinal: Negative for abdominal pain, blood in stool, constipation, diarrhea, melena, nausea and vomiting.       No prior colonoscopy.  Genitourinary: Negative for dysuria, frequency, hematuria and urgency.  Musculoskeletal: Negative for back pain, joint pain, myalgias and neck pain.       Right leg feels heavier than left leg.  Skin: Negative for itching and rash.  Neurological: Negative for dizziness, tingling, sensory change, weakness and headaches.  Endo/Heme/Allergies: Does not bruise/bleed easily.       Diabetes.  Psychiatric/Behavioral: Negative for depression and memory loss. The patient is not nervous/anxious and does not have insomnia.   All other systems reviewed and are negative.  Performance status (ECOG): 0  Vitals Blood pressure (!) 144/101, pulse 92, temperature (!) 97.2 F (36.2 C), temperature source Tympanic, resp. rate 18, height 6\' 3"  (1.905 m), weight (!) 325 lb 9.9 oz (147.7 kg), SpO2 97 %.   Physical Exam  Constitutional: He is oriented to  person, place, and time. He appears well-developed and well-nourished. No distress.  HENT:  Head: Normocephalic and atraumatic.  Mouth/Throat: Oropharynx is clear and moist. No oropharyngeal exudate.  Black cap and mask.  Close cut styled black hair. Upper dentures.  Eyes: Pupils are equal, round, and reactive to light. Conjunctivae and EOM are normal. No scleral icterus.  Neck: No JVD present.  Cardiovascular: Normal rate, regular rhythm and normal heart sounds.  No murmur heard. Pulmonary/Chest: Effort normal and breath sounds normal. No respiratory distress. He has no wheezes. He has no rales. He exhibits no tenderness.  Abdominal: Soft. Bowel sounds are normal. He exhibits no distension and no mass. There is no abdominal tenderness. There is  no rebound and no guarding.  Musculoskeletal:        General: Edema (right lower extremity below knee) present. No tenderness. Normal range of motion.     Cervical back: Normal range of motion and neck supple.  Lymphadenopathy:       Head (right side): No preauricular, no posterior auricular and no occipital adenopathy present.       Head (left side): No preauricular, no posterior auricular and no occipital adenopathy present.    He has no cervical adenopathy.    He has no axillary adenopathy.       Right: No inguinal adenopathy present.       Left: No inguinal and no supraclavicular adenopathy present.  Neurological: He is alert and oriented to person, place, and time.  Skin: Skin is warm and dry. No rash noted. He is not diaphoretic. No erythema. No pallor.  Psychiatric: He has a normal mood and affect. His behavior is normal. Judgment and thought content normal.  Nursing note and vitals reviewed.   No visits with results within 3 Day(s) from this visit.  Latest known visit with results is:  No results displayed because visit has over 200 results.      Assessment:  Austin Howell is a 53 y.o. male with multiple pulmonary emboli following COVID-19 diagnosis on 06/09/2019.  He presented with dehydration, acute hypoxic respiratory failure, and acute kidney injury.  Creatinine was 2.79 (baseline 1.12).  He is on Xarelto.  VQ scan on 06/19/2019 revealed multiple bilateral linear and wedge-shaped perfusion defects constituting High Probability of acute pulmonary embolism.  Echo on 06/20/2019 revealed an EF of 60-65%. There was mildly increased LVH.  The right atrium was severely dilated and the right ventricle mildly enlarged.   Symptomatically, he feels good.  He is off oxygen.  He denies any shortness of breath.  His right leg feels "heavier" and has some edema.  Plan: 1.   Labs today:  CBC with diff, CMP, Factor V Leiden, prothrombin gene mutation, lupus anticoagulant panel,  anticardiolipin antibodies, beta2-glycoprotein antibodies. 2.   Bilateral pulmonary emboli  Etiology felt secondary to COVID-19 and dehydration.  No family history of thrombosis.  Discuss follow-up with vascular surgery to assess lower extremities.  Discuss limited hypercoagulable work-up.  Discuss plan for 3-6 months of anticoagulation unless work-up reveals an abnormality.  Discuss plan for repeat lupus anticoagulant testing in 3 months if positive (can be affected by Xarelto).  Continue Xarelto 20 mg a day. 3.   RTC in 2 weeks for MD assessment (Doximitry) and review of work-up.  I discussed the assessment and treatment plan with the patient.  The patient was provided an opportunity to ask questions and all were answered.  The patient agreed with the plan and demonstrated an understanding of  the instructions.  The patient was advised to call back if the symptoms worsen or if the condition fails to improve as anticipated.   Appolonia Ackert C. Merlene Pulling, MD, PhD    07/18/2019, 2:14 PM  I, Theador Hawthorne, am acting as scribe for [employer]. Merlene Pulling, MD, PhD.  I, [place] Dodgen C. Merlene Pulling, MD, have reviewed the above documentation for accuracy and completeness, and I agree with the above.

## 2019-07-18 ENCOUNTER — Ambulatory Visit: Payer: BC Managed Care – PPO

## 2019-07-18 ENCOUNTER — Inpatient Hospital Stay: Payer: BC Managed Care – PPO | Attending: Hematology and Oncology | Admitting: Hematology and Oncology

## 2019-07-18 ENCOUNTER — Other Ambulatory Visit: Payer: Self-pay

## 2019-07-18 ENCOUNTER — Encounter: Payer: Self-pay | Admitting: Hematology and Oncology

## 2019-07-18 VITALS — BP 119/89 | HR 87 | Temp 97.2°F | Resp 18 | Ht 75.0 in | Wt 325.6 lb

## 2019-07-18 DIAGNOSIS — U071 COVID-19: Secondary | ICD-10-CM

## 2019-07-18 DIAGNOSIS — E785 Hyperlipidemia, unspecified: Secondary | ICD-10-CM | POA: Diagnosis not present

## 2019-07-18 DIAGNOSIS — I10 Essential (primary) hypertension: Secondary | ICD-10-CM | POA: Diagnosis not present

## 2019-07-18 DIAGNOSIS — J3489 Other specified disorders of nose and nasal sinuses: Secondary | ICD-10-CM

## 2019-07-18 DIAGNOSIS — E119 Type 2 diabetes mellitus without complications: Secondary | ICD-10-CM

## 2019-07-18 DIAGNOSIS — I2699 Other pulmonary embolism without acute cor pulmonale: Secondary | ICD-10-CM

## 2019-07-18 DIAGNOSIS — Z79899 Other long term (current) drug therapy: Secondary | ICD-10-CM

## 2019-07-18 DIAGNOSIS — R5383 Other fatigue: Secondary | ICD-10-CM

## 2019-07-18 DIAGNOSIS — Z801 Family history of malignant neoplasm of trachea, bronchus and lung: Secondary | ICD-10-CM

## 2019-07-18 DIAGNOSIS — Z7901 Long term (current) use of anticoagulants: Secondary | ICD-10-CM | POA: Diagnosis not present

## 2019-07-18 DIAGNOSIS — Z86711 Personal history of pulmonary embolism: Secondary | ICD-10-CM

## 2019-07-18 DIAGNOSIS — R634 Abnormal weight loss: Secondary | ICD-10-CM | POA: Diagnosis not present

## 2019-07-18 DIAGNOSIS — E7800 Pure hypercholesterolemia, unspecified: Secondary | ICD-10-CM

## 2019-07-18 DIAGNOSIS — Z86718 Personal history of other venous thrombosis and embolism: Secondary | ICD-10-CM | POA: Diagnosis not present

## 2019-07-18 LAB — CBC WITH DIFFERENTIAL/PLATELET
Abs Immature Granulocytes: 0.02 10*3/uL (ref 0.00–0.07)
Basophils Absolute: 0 10*3/uL (ref 0.0–0.1)
Basophils Relative: 0 %
Eosinophils Absolute: 0.1 10*3/uL (ref 0.0–0.5)
Eosinophils Relative: 2 %
HCT: 39.5 % (ref 39.0–52.0)
Hemoglobin: 12.9 g/dL — ABNORMAL LOW (ref 13.0–17.0)
Immature Granulocytes: 0 %
Lymphocytes Relative: 32 %
Lymphs Abs: 2.3 10*3/uL (ref 0.7–4.0)
MCH: 27.7 pg (ref 26.0–34.0)
MCHC: 32.7 g/dL (ref 30.0–36.0)
MCV: 84.8 fL (ref 80.0–100.0)
Monocytes Absolute: 0.8 10*3/uL (ref 0.1–1.0)
Monocytes Relative: 10 %
Neutro Abs: 4 10*3/uL (ref 1.7–7.7)
Neutrophils Relative %: 56 %
Platelets: 352 10*3/uL (ref 150–400)
RBC: 4.66 MIL/uL (ref 4.22–5.81)
RDW: 14.6 % (ref 11.5–15.5)
WBC: 7.3 10*3/uL (ref 4.0–10.5)
nRBC: 0 % (ref 0.0–0.2)

## 2019-07-18 LAB — COMPREHENSIVE METABOLIC PANEL
ALT: 24 U/L (ref 0–44)
AST: 19 U/L (ref 15–41)
Albumin: 3.8 g/dL (ref 3.5–5.0)
Alkaline Phosphatase: 68 U/L (ref 38–126)
Anion gap: 12 (ref 5–15)
BUN: 7 mg/dL (ref 6–20)
CO2: 23 mmol/L (ref 22–32)
Calcium: 8.8 mg/dL — ABNORMAL LOW (ref 8.9–10.3)
Chloride: 103 mmol/L (ref 98–111)
Creatinine, Ser: 0.78 mg/dL (ref 0.61–1.24)
GFR calc Af Amer: >60 mL/min (ref >60)
GFR calc non Af Amer: >60 mL/min (ref >60)
Glucose, Bld: 95 mg/dL (ref 70–99)
Potassium: 3.5 mmol/L (ref 3.5–5.1)
Sodium: 138 mmol/L (ref 135–145)
Total Bilirubin: 0.8 mg/dL (ref 0.3–1.2)
Total Protein: 7.5 g/dL (ref 6.5–8.1)

## 2019-07-18 NOTE — Progress Notes (Signed)
Patient here as new patient referral from Dr. Dema Severin d/t PE. Currently on Xarelto. Reports this was d/t being COVID + on 11/14. Patient was hospitalized x 1 week on 11/23 - 11/27.

## 2019-07-20 LAB — CARDIOLIPIN ANTIBODIES, IGG, IGM, IGA
Anticardiolipin IgA: <9 APL U/mL (ref 0–11)
Anticardiolipin IgG: <9 GPL U/mL (ref 0–14)
Anticardiolipin IgM: <9 MPL U/mL (ref 0–12)

## 2019-07-21 LAB — LUPUS ANTICOAGULANT PANEL
DRVVT: 64 s — ABNORMAL HIGH (ref 0.0–47.0)
PTT Lupus Anticoagulant: 43.5 s (ref 0.0–51.9)

## 2019-07-21 LAB — DRVVT CONFIRM: dRVVT Confirm: 1.3 ratio — ABNORMAL HIGH (ref 0.8–1.2)

## 2019-07-21 LAB — DRVVT MIX: dRVVT Mix: 52.9 s — ABNORMAL HIGH (ref 0.0–40.4)

## 2019-07-24 LAB — FACTOR 5 LEIDEN

## 2019-07-25 DIAGNOSIS — E782 Mixed hyperlipidemia: Secondary | ICD-10-CM | POA: Insufficient documentation

## 2019-07-25 LAB — PROTHROMBIN GENE MUTATION

## 2019-07-30 ENCOUNTER — Other Ambulatory Visit: Payer: Self-pay

## 2019-07-31 ENCOUNTER — Ambulatory Visit (INDEPENDENT_AMBULATORY_CARE_PROVIDER_SITE_OTHER): Payer: BC Managed Care – PPO | Admitting: Vascular Surgery

## 2019-07-31 ENCOUNTER — Encounter (INDEPENDENT_AMBULATORY_CARE_PROVIDER_SITE_OTHER): Payer: Self-pay

## 2019-07-31 ENCOUNTER — Other Ambulatory Visit: Payer: Self-pay

## 2019-07-31 ENCOUNTER — Encounter (INDEPENDENT_AMBULATORY_CARE_PROVIDER_SITE_OTHER): Payer: Self-pay | Admitting: Vascular Surgery

## 2019-07-31 VITALS — BP 123/79 | HR 73 | Resp 16 | Wt 331.0 lb

## 2019-07-31 DIAGNOSIS — E119 Type 2 diabetes mellitus without complications: Secondary | ICD-10-CM

## 2019-07-31 DIAGNOSIS — I2699 Other pulmonary embolism without acute cor pulmonale: Secondary | ICD-10-CM | POA: Diagnosis not present

## 2019-07-31 DIAGNOSIS — I82409 Acute embolism and thrombosis of unspecified deep veins of unspecified lower extremity: Secondary | ICD-10-CM | POA: Insufficient documentation

## 2019-07-31 DIAGNOSIS — I824Y1 Acute embolism and thrombosis of unspecified deep veins of right proximal lower extremity: Secondary | ICD-10-CM

## 2019-07-31 DIAGNOSIS — I10 Essential (primary) hypertension: Secondary | ICD-10-CM | POA: Diagnosis not present

## 2019-07-31 NOTE — Progress Notes (Signed)
Patient ID: Austin Howell, male   DOB: Dec 12, 1965, 54 y.o.   MRN: [id number]  Chief Complaint  Patient presents with  . New Patient (Initial Visit)    ref Edwyna Shell for PE    HPI Austin Howell is a 54 y.o. male.  I am asked to see the patient by E. White, NP for evaluation of DVT and pulmonary embolus with some residual right leg pain and swelling. About 6 weeks ago, the patient had Covid infection with severe pneumonia and developed right lower extremity DVT and pulmonary embolus. He has maintained anticoagulation which she is tolerating without issue. His leg pain is significantly improved although he does have some residual right leg swelling that is noticeable. This is mildly uncomfortable. His breathing has gotten markedly better. When he first went home from the hospital in early December, he was on oxygen. He is no longer on oxygen said he is able to do most activities with no noticeable shortness of breath.     Past Medical History:  Diagnosis Date  . High cholesterol   . History of blood clots   . Hypertension     Past Surgical History:  Procedure Laterality Date  . NO PAST SURGERIES       Family History  Problem Relation Age of Onset  . Cervical cancer Mother   . Lung cancer Father   . Cervical cancer Sister   No bleeding or clotting disorders   Social History   Tobacco Use  . Smoking status: Never Smoker  . Smokeless tobacco: Never Used  Substance Use Topics  . Alcohol use: No    Alcohol/week: 0.0 standard drinks  . Drug use: No    No Known Allergies  Current Outpatient Medications  Medication Sig Dispense Refill  . acetaminophen (TYLENOL) 500 MG tablet Take 1,000 mg by mouth every 6 (six) hours as needed for fever.    Marland Kitchen atorvastatin (LIPITOR) 10 MG tablet Take 10 mg by mouth daily at 6 PM.     . lisinopril-hydrochlorothiazide (ZESTORETIC) 20-12.5 MG tablet Take 1 tablet by mouth daily.     . rivaroxaban (XARELTO) 20 MG TABS tablet Take 1 tablet (20 mg  total) by mouth daily with supper. START TAKING ONLY AFTER YOU FINISH THE STARTER PACK 30 tablet 0  . sildenafil (VIAGRA) 25 MG tablet Take 25 mg by mouth daily as needed.    Marland Kitchen albuterol (VENTOLIN HFA) 108 (90 Base) MCG/ACT inhaler Inhale 2 puffs into the lungs every 6 (six) hours as needed for wheezing or shortness of breath. (Patient not taking: Reported on 07/18/2019) 6.7 g 0   No current facility-administered medications for this visit.      REVIEW OF SYSTEMS (Negative unless checked)  Constitutional: [] Weight loss  [] Fever  [] Chills Cardiac: [] Chest pain   [] Chest pressure   [] Palpitations   [] Shortness of breath when laying flat   [] Shortness of breath at rest   [x] Shortness of breath with exertion. Vascular:  [] Pain in legs with walking   [] Pain in legs at rest   [] Pain in legs when laying flat   [] Claudication   [] Pain in feet when walking  [] Pain in feet at rest  [] Pain in feet when laying flat   [x] History of DVT   [] Phlebitis   [x] Swelling in legs   [] Varicose veins   [] Non-healing ulcers Pulmonary:   [] Uses home oxygen   [] Productive cough   [] Hemoptysis   [] Wheeze  [] COPD   [] Asthma Neurologic:  [] Dizziness  [] Blackouts   []   Seizures   [] History of stroke   [] History of TIA  [] Aphasia   [] Temporary blindness   [] Dysphagia   [] Weakness or numbness in arms   [] Weakness or numbness in legs Musculoskeletal:  [] Arthritis   [] Joint swelling   [] Joint pain   [] Low back pain Hematologic:  [] Easy bruising  [] Easy bleeding   [] Hypercoagulable state   [] Anemic  [] Hepatitis Gastrointestinal:  [] Blood in stool   [] Vomiting blood  [] Gastroesophageal reflux/heartburn   [] Abdominal pain Genitourinary:  [] Chronic kidney disease   [] Difficult urination  [] Frequent urination  [] Burning with urination   [] Hematuria Skin:  [] Rashes   [] Ulcers   [] Wounds Psychological:  [] History of anxiety   []  History of major depression.    Physical Exam BP 123/79 (BP Location: Left Arm)   Pulse 73   Resp 16    Wt (!) 331 lb (150.1 kg)   BMI 41.37 kg/m  Gen:  WD/WN, NAD Head: [place]/AT, No temporalis wasting.  Ear/Nose/Throat: Hearing grossly intact, nares w/o erythema or drainage, oropharynx w/o Erythema/Exudate Eyes: Conjunctiva clear, sclera non-icteric  Neck: trachea midline.  No JVD.  Pulmonary:  Good air movement, respirations not labored, no use of accessory muscles  Cardiac: RRR, no JVD Vascular:  Vessel Right Left  Radial Palpable Palpable                                   Gastrointestinal:. No masses, surgical incisions, or scars. Musculoskeletal: M/S 5/5 throughout.  Extremities without ischemic changes.  No deformity or atrophy. 1-2+ right lower extremity edema. No noticeable left lower extremity edema. Neurologic: Sensation grossly intact in extremities.  Symmetrical.  Speech is fluent. Motor exam as listed above. Psychiatric: Judgment intact, Mood & affect appropriate for pt's clinical situation. Dermatologic: No rashes or ulcers noted.  No cellulitis or open wounds.    Radiology No results found.  Labs Recent Results (from the past 2160 hour(s))  CBC with Differential     Status: Abnormal   Collection Time: 06/18/19  6:15 PM  Result Value Ref Range   WBC 22.0 (H) 4.0 - 10.5 K/uL   RBC 6.74 (H) 4.22 - 5.81 MIL/uL   Hemoglobin 18.4 (H) 13.0 - 17.0 g/dL   HCT 55.0 (H) 39.0 - 52.0 %   MCV 81.6 80.0 - 100.0 fL   MCH 27.3 26.0 - 34.0 pg   MCHC 33.5 30.0 - 36.0 g/dL   RDW 14.3 11.5 - 15.5 %   Platelets 139 (L) 150 - 400 K/uL   nRBC 0.2 0.0 - 0.2 %   Neutrophils Relative % 78 %   Neutro Abs 17.3 (H) 1.7 - 7.7 K/uL   Lymphocytes Relative 9 %   Lymphs Abs 2.0 0.7 - 4.0 K/uL   Monocytes Relative 9 %   Monocytes Absolute 1.9 (H) 0.1 - 1.0 K/uL   Eosinophils Relative 0 %   Eosinophils Absolute 0.0 0.0 - 0.5 K/uL   Basophils Relative 1 %   Basophils Absolute 0.1 0.0 - 0.1 K/uL   Immature Granulocytes 3 %   Abs Immature Granulocytes 0.65 (H) 0.00 - 0.07 K/uL     Comment: Performed at [facility], [address] [place], [place] [address]  Comprehensive metabolic panel     Status: Abnormal   Collection Time: 06/18/19  6:15 PM  Result Value Ref Range   Sodium 141 135 - 145 mmol/L   Potassium 3.9 3.5 - 5.1 mmol/L  Chloride 100 98 - 111 mmol/L   CO2 21 (L) 22 - 32 mmol/L   Glucose, Bld 205 (H) 70 - 99 mg/dL   BUN 31 (H) 6 - 20 mg/dL   Creatinine, Ser 1.61 (H) 0.61 - 1.24 mg/dL   Calcium 8.5 (L) 8.9 - 10.3 mg/dL   Total Protein 7.9 6.5 - 8.1 g/dL   Albumin 3.1 (L) 3.5 - 5.0 g/dL   AST 59 (H) 15 - 41 U/L   ALT 97 (H) 0 - 44 U/L   Alkaline Phosphatase 67 38 - 126 U/L   Total Bilirubin 1.2 0.3 - 1.2 mg/dL   GFR calc non Af Amer 25 (L) >60 mL/min   GFR calc Af Amer 29 (L) >60 mL/min   Anion gap 20 (H) 5 - 15    Comment: Performed at [facility], [address] [place], [place] [address]  Procalcitonin - Baseline     Status: None   Collection Time: 06/18/19  6:15 PM  Result Value Ref Range   Procalcitonin 5.55 ng/mL    Comment:        Interpretation: PCT > 2 ng/mL: Systemic infection (sepsis) is likely, unless other causes are known. (NOTE)       Sepsis PCT Algorithm           Lower Respiratory Tract                                      Infection PCT Algorithm    ----------------------------     ----------------------------         PCT < 0.25 ng/mL                PCT < 0.10 ng/mL         Strongly encourage             Strongly discourage   discontinuation of antibiotics    initiation of antibiotics    ----------------------------     -----------------------------       PCT 0.25 - 0.50 ng/mL            PCT 0.10 - 0.25 ng/mL               OR       >80% decrease in PCT            Discourage initiation of                                            antibiotics      Encourage discontinuation           of antibiotics    ----------------------------     -----------------------------         PCT >= 0.50 ng/mL               PCT 0.26 - 0.50 ng/mL               AND       <80% decrease in PCT              Encourage initiation of  antibiotics       Encourage continuation           of antibiotics    ----------------------------     -----------------------------        PCT >= 0.50 ng/mL                  PCT > 0.50 ng/mL               AND         increase in PCT                  Strongly encourage                                      initiation of antibiotics    Strongly encourage escalation           of antibiotics                                     -----------------------------                                           PCT <= 0.25 ng/mL                                                 OR                                        > 80% decrease in PCT                                     Discontinue / Do not initiate                                             antibiotics Performed at [facility], [address] [place], [place] [address]   C-reactive protein     Status: Abnormal   Collection Time: 06/18/19  6:15 PM  Result Value Ref Range   CRP 21.6 (H) <1.0 mg/dL    Comment: Performed at [facility], [address] [place], [place] [address]  Fibrin derivatives D-Dimer [facility] only)     Status: Abnormal   Collection Time: 06/18/19  6:15 PM  Result Value Ref Range   Fibrin derivatives D-dimer ([facility]) >10,000.00 (H) 0.00 - 499.00 ng/mL (FEU)    Comment: (NOTE) <> Exclusion of Venous Thromboembolism (VTE) - OUTPATIENT ONLY   (Emergency Department or [place])   0-499 ng/ml (FEU): With a low to intermediate pretest probability                      for VTE this test result excludes the diagnosis  of VTE.   >499 ng/ml (FEU) : VTE not excluded; additional work up for VTE is                      required. <> Testing on Inpatients and Evaluation of Disseminated Intravascular   Coagulation (DIC) Reference Range:   0-499 ng/ml  (FEU) Performed at [facility], [address] [place], [place] [address]   Lactic acid, plasma     Status: Abnormal   Collection Time: 06/18/19  6:15 PM  Result Value Ref Range   Lactic Acid, Venous 4.8 (HH) 0.5 - 1.9 mmol/L    Comment: CRITICAL RESULT CALLED TO, READ BACK BY AND VERIFIED WITH KATIE FERGUSSON @1900  06/18/19 MJU Performed at [facility], [address] [place], [place]   Brain natriuretic peptide     Status: Abnormal   Collection Time: 06/18/19  6:15 PM  Result Value Ref Range   B Natriuretic Peptide 543.0 (H) 0.0 - 100.0 pg/mL    Comment: Performed at [facility], [address] [place], [place]  Blood culture (routine x 2)     Status: None   Collection Time: 06/18/19  6:15 PM   Specimen: BLOOD  Result Value Ref Range   Specimen Description BLOOD BLOOD RIGHT ARM    Special Requests      BOTTLES DRAWN AEROBIC AND ANAEROBIC Blood Culture results may not be optimal due to an excessive volume of blood received in culture bottles   Culture      NO GROWTH 5 DAYS Performed at [facility], [address] [place], [place]    Report Status 06/23/2019 FINAL   Protime-INR     Status: Abnormal   Collection Time: 06/18/19  6:15 PM  Result Value Ref Range   Prothrombin Time 16.2 (H) 11.4 - 15.2 seconds   INR 1.3 (H) 0.8 - 1.2    Comment: (NOTE) INR goal varies based on device and disease states. Performed at [facility], [address] [place], [place]   APTT     Status: None   Collection Time: 06/18/19  6:15 PM  Result Value Ref Range   aPTT 32 24 - 36 seconds    Comment: Performed at [facility], [address] [place], [place]  Blood culture (routine x 2)     Status: None   Collection Time: 06/18/19  7:03 PM   Specimen: BLOOD  Result Value Ref Range   Specimen Description BLOOD BLOOD RIGHT HAND    Special Requests      BOTTLES DRAWN AEROBIC  ONLY Blood Culture results may not be optimal due to an inadequate volume of blood received in culture bottles   Culture      NO GROWTH 5 DAYS Performed at [facility], [address] [place], [place]    Report Status 06/23/2019 FINAL   Lactic acid, plasma     Status: Abnormal   Collection Time: 06/18/19  7:53 PM  Result Value Ref Range   Lactic Acid, Venous 3.2 (HH) 0.5 - 1.9 mmol/L    Comment: CRITICAL VALUE NOTED. VALUE IS CONSISTENT WITH PREVIOUSLY REPORTED/CALLED VALUE/TFK Performed at [facility], [address] [place] [address] [place], [place]   Troponin I (High Sensitivity)     Status: Abnormal   Collection Time: 06/18/19  7:53 PM  Result Value Ref Range   Troponin I (High Sensitivity) 548 (HH) <18 ng/L  Comment: CRITICAL RESULT CALLED TO, READ BACK BY AND VERIFIED WITH GRACIE [facility] AT 2050 06/18/2019  TFK (NOTE) Elevated high sensitivity troponin I (hsTnI) values and significant  changes across serial measurements may suggest ACS but many other  chronic and acute conditions are known to elevate hsTnI results.  Refer to the "Links" section for chest pain algorithms and additional  guidance. Performed at [facility], [address] [place], [place] [address]   Troponin I (High Sensitivity)     Status: Abnormal   Collection Time: 06/18/19  9:46 PM  Result Value Ref Range   Troponin I (High Sensitivity) 489 (HH) <18 ng/L    Comment: CRITICAL VALUE NOTED. VALUE IS CONSISTENT WITH PREVIOUSLY REPORTED/CALLED VALUE/TFK (NOTE) Elevated high sensitivity troponin I (hsTnI) values and significant  changes across serial measurements may suggest ACS but many other  chronic and acute conditions are known to elevate hsTnI results.  Refer to the "Links" section for chest pain algorithms and additional  guidance. Performed at [facility], [address] [place], [place] [address]   Heparin level (unfractionated)     Status:  Abnormal   Collection Time: 06/19/19  4:09 AM  Result Value Ref Range   Heparin Unfractionated 2.18 (H) 0.30 - 0.70 IU/mL    Comment: RESULTS CONFIRMED BY MANUAL DILUTION (NOTE) If heparin results are below expected values, and patient dosage has  been confirmed, suggest follow up testing of antithrombin III levels. Performed at [facility], [address] [place], [place] [address]   Heparin level (unfractionated)     Status: None   Collection Time: 06/19/19  6:40 AM  Result Value Ref Range   Heparin Unfractionated 0.48 0.30 - 0.70 IU/mL    Comment: (NOTE) If heparin results are below expected values, and patient dosage has  been confirmed, suggest follow up testing of antithrombin III levels. Performed at [facility], [address] [place], [place] [address]   CBC     Status: Abnormal   Collection Time: 06/19/19  1:33 PM  Result Value Ref Range   WBC 24.3 (H) 4.0 - 10.5 K/uL   RBC 4.89 4.22 - 5.81 MIL/uL   Hemoglobin 13.3 13.0 - 17.0 g/dL   HCT 29.541.1 62.139.0 - 30.852.0 %   MCV 84.0 80.0 - 100.0 fL   MCH 27.2 26.0 - 34.0 pg   MCHC 32.4 30.0 - 36.0 g/dL   RDW 65.713.8 84.611.5 - 96.215.5 %   Platelets 197 150 - 400 K/uL   nRBC 0.0 0.0 - 0.2 %    Comment: Performed at [facility], [address] [place], [place] [address]  Heparin level (unfractionated)     Status: None   Collection Time: 06/19/19  1:33 PM  Result Value Ref Range   Heparin Unfractionated 0.48 0.30 - 0.70 IU/mL    Comment: (NOTE) If heparin results are below expected values, and patient dosage has  been confirmed, suggest follow up testing of antithrombin III levels. Performed at [facility], [address] [place], [place] [address]   Creatinine, serum     Status: Abnormal   Collection Time: 06/19/19  1:33 PM  Result Value Ref Range   Creatinine, Ser 1.48 (H) 0.61 - 1.24 mg/dL   GFR calc non Af Amer 54 (L) >60 mL/min   GFR calc Af Amer >60 >60 mL/min    Comment: Performed  at [facility], [address] [place] [address] [place], [place] [address]  Fibrin derivatives D-Dimer  Status: Abnormal   Collection Time: 06/19/19  1:33 PM  Result Value Ref Range   Fibrin derivatives D-dimer ([facility]) >7,500.00 (H) 0.00 - 499.00 ng/mL (FEU)    Comment: Performed at [facility], [address] [place], [place] [address]  Type and screen     Status: None   Collection Time: 06/19/19  1:33 PM  Result Value Ref Range   ABO/RH(D) O POS    Antibody Screen NEG    Sample Expiration      06/22/2019,2359 Performed at [facility], [address] [place], [place] [address]   Procalcitonin - Baseline     Status: None   Collection Time: 06/19/19  1:33 PM  Result Value Ref Range   Procalcitonin 3.47 ng/mL    Comment:        Interpretation: PCT > 2 ng/mL: Systemic infection (sepsis) is likely, unless other causes are known. (NOTE)       Sepsis PCT Algorithm           Lower Respiratory Tract                                      Infection PCT Algorithm    ----------------------------     ----------------------------         PCT < 0.25 ng/mL                PCT < 0.10 ng/mL         Strongly encourage             Strongly discourage   discontinuation of antibiotics    initiation of antibiotics    ----------------------------     -----------------------------       PCT 0.25 - 0.50 ng/mL            PCT 0.10 - 0.25 ng/mL               OR       >80% decrease in PCT            Discourage initiation of                                            antibiotics      Encourage discontinuation           of antibiotics    ----------------------------     -----------------------------         PCT >= 0.50 ng/mL              PCT 0.26 - 0.50 ng/mL               AND       <80% decrease in PCT              Encourage initiation of                                             antibiotics       Encourage continuation           of antibiotics    ----------------------------      -----------------------------        PCT >= 0.50 ng/mL  PCT > 0.50 ng/mL               AND         increase in PCT                  Strongly encourage                                      initiation of antibiotics    Strongly encourage escalation           of antibiotics                                     -----------------------------                                           PCT <= 0.25 ng/mL                                                 OR                                        > 80% decrease in PCT                                     Discontinue / Do not initiate                                             antibiotics Performed at [facility], [address] [place], [place] [address]   Hemoglobin A1c     Status: Abnormal   Collection Time: 06/19/19  2:45 PM  Result Value Ref Range   Hgb A1c MFr Bld 6.8 (H) 4.8 - 5.6 %    Comment: (NOTE) Pre diabetes:          5.7%-6.4% Diabetes:              >6.4% Glycemic control for   <7.0% adults with diabetes    Mean Plasma Glucose 148.46 mg/dL    Comment: Performed at [facility], [address] [place], [place] [address]  HIV Antibody (routine testing w rflx)     Status: None   Collection Time: 06/19/19  2:45 PM  Result Value Ref Range   HIV Screen 4th Generation wRfx NON REACTIVE NON REACTIVE    Comment: Performed at [facility], [address] [place], [place] [address]  ABO/Rh     Status: None   Collection Time: 06/19/19  2:45 PM  Result Value Ref Range   ABO/RH(D)      O POS Performed at [facility], [address], [place], [place] [address]   C-reactive protein     Status: Abnormal   Collection Time: 06/19/19  2:45 PM  Result  Value Ref Range   CRP 18.2 (H) <1.0 mg/dL    Comment: Performed at [facility], [address], [place], [place] [address]  CBC with Differential/Platelet     Status: Abnormal   Collection Time: 06/19/19  2:45 PM    Result Value Ref Range   WBC 25.8 (H) 4.0 - 10.5 K/uL   RBC 5.09 4.22 - 5.81 MIL/uL   Hemoglobin 14.0 13.0 - 17.0 g/dL   HCT 60.4 54.0 - 98.1 %   MCV 85.3 80.0 - 100.0 fL   MCH 27.5 26.0 - 34.0 pg   MCHC 32.3 30.0 - 36.0 g/dL   RDW 19.1 47.8 - 29.5 %   Platelets 201 150 - 400 K/uL   nRBC 0.1 0.0 - 0.2 %   Neutrophils Relative % 81 %   Neutro Abs 20.9 (H) 1.7 - 7.7 K/uL   Lymphocytes Relative 10 %   Lymphs Abs 2.6 0.7 - 4.0 K/uL   Monocytes Relative 7 %   Monocytes Absolute 1.7 (H) 0.1 - 1.0 K/uL   Eosinophils Relative 0 %   Eosinophils Absolute 0.0 0.0 - 0.5 K/uL   Basophils Relative 0 %   Basophils Absolute 0.1 0.0 - 0.1 K/uL   WBC Morphology VACUOLATED NEUTROPHILS    Immature Granulocytes 2 %   Abs Immature Granulocytes 0.56 (H) 0.00 - 0.07 K/uL   Polychromasia PRESENT    Basophilic Stippling PRESENT    Giant PLTs PRESENT     Comment: Performed at [facility], [address] [place], [place] [address]  Comprehensive metabolic panel     Status: Abnormal   Collection Time: 06/19/19  2:45 PM  Result Value Ref Range   Sodium 138 135 - 145 mmol/L   Potassium 5.1 3.5 - 5.1 mmol/L   Chloride 109 98 - 111 mmol/L   CO2 17 (L) 22 - 32 mmol/L   Glucose, Bld 167 (H) 70 - 99 mg/dL   BUN 31 (H) 6 - 20 mg/dL   Creatinine, Ser 8.65 (H) 0.61 - 1.24 mg/dL   Calcium 7.7 (L) 8.9 - 10.3 mg/dL   Total Protein 6.9 6.5 - 8.1 g/dL   Albumin 2.8 (L) 3.5 - 5.0 g/dL   AST 784 (H) 15 - 41 U/L   ALT 102 (H) 0 - 44 U/L   Alkaline Phosphatase 71 38 - 126 U/L   Total Bilirubin 1.1 0.3 - 1.2 mg/dL   GFR calc non Af Amer 53 (L) >60 mL/min   GFR calc Af Amer >60 >60 mL/min   Anion gap 12 5 - 15    Comment: Performed at [facility], [address], [place], [place] [address]  Ferritin     Status: Abnormal   Collection Time: 06/19/19  2:45 PM  Result Value Ref Range   Ferritin 2,070 (H) 24 - 336 ng/mL    Comment: Performed at [facility],  [address] [place], [place] [address]  Procalcitonin     Status: None   Collection Time: 06/19/19  2:45 PM  Result Value Ref Range   Procalcitonin 3.34 ng/mL    Comment:        Interpretation: PCT > 2 ng/mL: Systemic infection (sepsis) is likely, unless other causes are known. (NOTE)       Sepsis PCT Algorithm           Lower Respiratory Tract  Infection PCT Algorithm    ----------------------------     ----------------------------         PCT < 0.25 ng/mL                PCT < 0.10 ng/mL         Strongly encourage             Strongly discourage   discontinuation of antibiotics    initiation of antibiotics    ----------------------------     -----------------------------       PCT 0.25 - 0.50 ng/mL            PCT 0.10 - 0.25 ng/mL               OR       >80% decrease in PCT            Discourage initiation of                                            antibiotics      Encourage discontinuation           of antibiotics    ----------------------------     -----------------------------         PCT >= 0.50 ng/mL              PCT 0.26 - 0.50 ng/mL               AND       <80% decrease in PCT              Encourage initiation of                                             antibiotics       Encourage continuation           of antibiotics    ----------------------------     -----------------------------        PCT >= 0.50 ng/mL                  PCT > 0.50 ng/mL               AND         increase in PCT                  Strongly encourage                                      initiation of antibiotics    Strongly encourage escalation           of antibiotics                                     -----------------------------                                           PCT <= 0.25 ng/mL  OR                                        > 80% decrease in PCT                                     Discontinue / Do  not initiate                                             antibiotics Performed at [facility], [address] [place], [place] [id number]   Lactate dehydrogenase     Status: Abnormal   Collection Time: 06/19/19  2:45 PM  Result Value Ref Range   LDH 1,025 (H) 98 - 192 U/L    Comment: Performed at [facility], [address] [place], [place] [address]  Culture, blood (Routine X 2) w Reflex to ID Panel     Status: None   Collection Time: 06/19/19  2:45 PM   Specimen: BLOOD RIGHT HAND  Result Value Ref Range   Specimen Description      BLOOD RIGHT HAND Performed at [facility], [address] [place], [place] [address]    Special Requests      BOTTLES DRAWN AEROBIC ONLY Blood Culture results may not be optimal due to an inadequate volume of blood received in culture bottles Performed at [facility], [address] [place], [place] [address]    Culture      NO GROWTH 5 DAYS Performed at [facility], [address] [place], [place] [address]    Report Status 06/24/2019 FINAL   Osmolality     Status: Abnormal   Collection Time: 06/19/19  2:45 PM  Result Value Ref Range   Osmolality 304 (H) 275 - 295 mOsm/kg    Comment: Performed at [facility], [address] [place], [place] [address]  Glucose, capillary     Status: Abnormal   Collection Time: 06/19/19  4:05 PM  Result Value Ref Range   Glucose-Capillary 132 (H) 70 - 99 mg/dL  Culture, Urine     Status: None   Collection Time: 06/19/19  4:40 PM   Specimen: Urine, Catheterized  Result Value Ref Range   Specimen Description      URINE, CATHETERIZED Performed at [facility], [address] [place], [place] [address]    Special Requests      NONE Performed at [facility], [address] [place], [place] [address]    Culture      NO GROWTH Performed at [facility], [address] [place], [place] [address]    Report Status 06/20/2019 FINAL   Urinalysis, Routine w reflex microscopic     Status: Abnormal   Collection Time: 06/19/19  4:43 PM  Result Value Ref Range   Color, Urine AMBER (A) YELLOW    Comment: BIOCHEMICALS MAY BE AFFECTED BY COLOR   APPearance HAZY (A) CLEAR   Specific Gravity, Urine 1.024 1.005 - 1.030   pH 5.0 5.0 - 8.0   Glucose, UA NEGATIVE NEGATIVE mg/dL   Hgb urine dipstick MODERATE (A) NEGATIVE  Bilirubin Urine NEGATIVE NEGATIVE   Ketones, ur NEGATIVE NEGATIVE mg/dL   Protein, ur 30 (A) NEGATIVE mg/dL   Nitrite NEGATIVE NEGATIVE   Leukocytes,Ua NEGATIVE NEGATIVE   RBC / HPF 0-5 0 - 5 RBC/hpf   WBC, UA 0-5 0 - 5 WBC/hpf   Bacteria, UA NONE SEEN NONE SEEN   Squamous Epithelial / LPF 0-5 0 - 5   Mucus PRESENT     Comment: Performed at [facility], [address] [place], [place] [address]  Creatinine, urine, random     Status: None   Collection Time: 06/19/19  4:43 PM  Result Value Ref Range   Creatinine, Urine 286.29 mg/dL    Comment: Performed at [facility], [address], [place], [place] [address]  Sodium, urine, random     Status: None   Collection Time: 06/19/19  4:43 PM  Result Value Ref Range   Sodium, Ur <10 mmol/L    Comment: Performed at [facility], [address], [place], [place] [address]  Osmolality, urine     Status: None   Collection Time: 06/19/19  4:43 PM  Result Value Ref Range   Osmolality, Ur 734 300 - 900 mOsm/kg    Comment: Performed at [facility], [address], [place], [place] [address]  Prepare fresh frozen plasma     Status: None   Collection Time: 06/19/19  5:57 PM  Result Value Ref Range   Unit Number [id number]    Blood Component Type FP24, THAWED    Unit division 00    Status of Unit ISSUED,FINAL    Transfusion Status      OK TO TRANSFUSE Performed at [facility], [address], [place], [place]  [address]   BPAM FFP     Status: None   Collection Time: 06/19/19  5:57 PM  Result Value Ref Range   ISSUE DATE / TIME [id number]    Blood Product Unit Number [id number]    PRODUCT CODE [id number]    Unit Type and Rh 5100    Blood Product Expiration Date [id number]   Glucose, capillary     Status: Abnormal   Collection Time: 06/19/19  9:12 PM  Result Value Ref Range   Glucose-Capillary 221 (H) 70 - 99 mg/dL  BNP daily     Status: Abnormal   Collection Time: 06/20/19  4:45 AM  Result Value Ref Range   B Natriuretic Peptide 369.7 (H) 0.0 - 100.0 pg/mL    Comment: Performed at [facility], [address], [place], [place] [address]  CBC with Differential/Platelet     Status: Abnormal   Collection Time: 06/20/19  4:45 AM  Result Value Ref Range   WBC 22.3 (H) 4.0 - 10.5 K/uL   RBC 4.62 4.22 - 5.81 MIL/uL   Hemoglobin 12.7 (L) 13.0 - 17.0 g/dL   HCT 41.2 87.8 - 67.6 %   MCV 84.6 80.0 - 100.0 fL   MCH 27.5 26.0 - 34.0 pg   MCHC 32.5 30.0 - 36.0 g/dL   RDW 72.0 94.7 - 09.6 %   Platelets 229 150 - 400 K/uL   nRBC 0.0 0.0 - 0.2 %   Neutrophils Relative % 83 %   Neutro Abs 18.4 (H) 1.7 - 7.7 K/uL   Lymphocytes Relative 9 %   Lymphs Abs 2.0 0.7 - 4.0 K/uL   Monocytes Relative 6 %   Monocytes Absolute 1.4 (H) 0.1 - 1.0 K/uL  Eosinophils Relative 0 %   Eosinophils Absolute 0.0 0.0 - 0.5 K/uL   Basophils Relative 0 %   Basophils Absolute 0.1 0.0 - 0.1 K/uL   Immature Granulocytes 2 %   Abs Immature Granulocytes 0.51 (H) 0.00 - 0.07 K/uL    Comment: Performed at [facility], [address], [place], [place] [address]  Comprehensive metabolic panel     Status: Abnormal   Collection Time: 06/20/19  4:45 AM  Result Value Ref Range   Sodium 139 135 - 145 mmol/L   Potassium 4.5 3.5 - 5.1 mmol/L   Chloride 107 98 - 111 mmol/L   CO2 20 (L) 22 - 32 mmol/L   Glucose, Bld 185 (H) 70 - 99 mg/dL   BUN 29 (H) 6 - 20 mg/dL   Creatinine, Ser 2.35 0.61  - 1.24 mg/dL   Calcium 8.1 (L) 8.9 - 10.3 mg/dL   Total Protein 6.9 6.5 - 8.1 g/dL   Albumin 2.8 (L) 3.5 - 5.0 g/dL   AST 83 (H) 15 - 41 U/L   ALT 91 (H) 0 - 44 U/L   Alkaline Phosphatase 68 38 - 126 U/L   Total Bilirubin 0.7 0.3 - 1.2 mg/dL   GFR calc non Af Amer >60 >60 mL/min   GFR calc Af Amer >60 >60 mL/min   Anion gap 12 5 - 15    Comment: Performed at [facility], [address] [place], [place] [address]  Magnesium     Status: None   Collection Time: 06/20/19  4:45 AM  Result Value Ref Range   Magnesium 2.1 1.7 - 2.4 mg/dL    Comment: Performed at [facility], [address] [place], [place] [address]  D-dimer, quantitative (not at [facility])     Status: Abnormal   Collection Time: 06/20/19  4:45 AM  Result Value Ref Range   D-Dimer, Quant >20.00 (H) 0.00 - 0.50 ug/mL-FEU    Comment: (NOTE) At the manufacturer cut-off of 0.50 ug/mL FEU, this assay has been documented to exclude PE with a sensitivity and negative predictive value of 97 to 99%.  At this time, this assay has not been approved by the FDA to exclude DVT/VTE. Results should be correlated with clinical presentation. Performed at [facility], [address], [place], [place] [address]   C-reactive protein     Status: Abnormal   Collection Time: 06/20/19  4:45 AM  Result Value Ref Range   CRP 12.5 (H) <1.0 mg/dL    Comment: Performed at [facility], [address], [place], [place] [address]  Heparin level (unfractionated)     Status: Abnormal   Collection Time: 06/20/19  5:15 AM  Result Value Ref Range   Heparin Unfractionated <0.10 (L) 0.30 - 0.70 IU/mL    Comment: (NOTE) If heparin results are below expected values, and patient dosage has  been confirmed, suggest follow up testing of antithrombin III levels. Performed at [facility], [address], [place], [place] [address]   Lactic acid, plasma      Status: None   Collection Time: 06/20/19  5:15 AM  Result Value Ref Range   Lactic Acid, Venous 1.8 0.5 - 1.9 mmol/L    Comment: Performed at [facility], [address], [place], [place] [address]  Glucose, capillary     Status: Abnormal   Collection Time: 06/20/19  7:59 AM  Result Value Ref Range   Glucose-Capillary  172 (H) 70 - 99 mg/dL  Glucose, capillary     Status: Abnormal   Collection Time: 06/20/19 11:57 AM  Result Value Ref Range   Glucose-Capillary 242 (H) 70 - 99 mg/dL  ECHOCARDIOGRAM COMPLETE     Status: None   Collection Time: 06/20/19 12:06 PM  Result Value Ref Range   BP 137/99 mmHg  Heparin level (unfractionated)     Status: None   Collection Time: 06/20/19  1:25 PM  Result Value Ref Range   Heparin Unfractionated 0.31 0.30 - 0.70 IU/mL    Comment: (NOTE) If heparin results are below expected values, and patient dosage has  been confirmed, suggest follow up testing of antithrombin III levels. Performed at [facility], [address], [place], [place] [address]   Glucose, capillary     Status: Abnormal   Collection Time: 06/20/19  5:05 PM  Result Value Ref Range   Glucose-Capillary 212 (H) 70 - 99 mg/dL  Glucose, capillary     Status: Abnormal   Collection Time: 06/20/19  8:38 PM  Result Value Ref Range   Glucose-Capillary 224 (H) 70 - 99 mg/dL  Heparin level (unfractionated)     Status: None   Collection Time: 06/20/19 10:00 PM  Result Value Ref Range   Heparin Unfractionated 0.46 0.30 - 0.70 IU/mL    Comment: (NOTE) If heparin results are below expected values, and patient dosage has  been confirmed, suggest follow up testing of antithrombin III levels. Performed at [facility], [address], [place], [place] [address]   BNP daily     Status: Abnormal   Collection Time: 06/21/19  4:05 AM  Result Value Ref Range   B Natriuretic Peptide 284.8 (H) 0.0 - 100.0 pg/mL    Comment: Performed at  [facility], [address], [place], [place] [address]  CBC with Differential/Platelet     Status: Abnormal   Collection Time: 06/21/19  4:05 AM  Result Value Ref Range   WBC 25.0 (H) 4.0 - 10.5 K/uL   RBC 4.76 4.22 - 5.81 MIL/uL   Hemoglobin 13.0 13.0 - 17.0 g/dL   HCT 91.4 78.2 - 95.6 %   MCV 85.3 80.0 - 100.0 fL   MCH 27.3 26.0 - 34.0 pg   MCHC 32.0 30.0 - 36.0 g/dL   RDW 21.3 08.6 - 57.8 %   Platelets 271 150 - 400 K/uL   nRBC 0.1 0.0 - 0.2 %   Neutrophils Relative % 79 %   Neutro Abs 19.7 (H) 1.7 - 7.7 K/uL   Lymphocytes Relative 9 %   Lymphs Abs 2.3 0.7 - 4.0 K/uL   Monocytes Relative 7 %   Monocytes Absolute 1.7 (H) 0.1 - 1.0 K/uL   Eosinophils Relative 0 %   Eosinophils Absolute 0.0 0.0 - 0.5 K/uL   Basophils Relative 0 %   Basophils Absolute 0.1 0.0 - 0.1 K/uL   Immature Granulocytes 5 %   Abs Immature Granulocytes 1.21 (H) 0.00 - 0.07 K/uL    Comment: Performed at [facility], [address], [place], [place] [address]  Comprehensive metabolic panel     Status: Abnormal   Collection Time: 06/21/19  4:05 AM  Result Value Ref Range   Sodium 140 135 - 145 mmol/L   Potassium 4.4 3.5 - 5.1 mmol/L   Chloride 108 98 - 111 mmol/L   CO2 21 (L) 22 - 32 mmol/L   Glucose, Bld 183 (H) 70 -  99 mg/dL   BUN 31 (H) 6 - 20 mg/dL   Creatinine, Ser 4.09 0.61 - 1.24 mg/dL   Calcium 8.2 (L) 8.9 - 10.3 mg/dL   Total Protein 6.7 6.5 - 8.1 g/dL   Albumin 2.8 (L) 3.5 - 5.0 g/dL   AST 56 (H) 15 - 41 U/L   ALT 78 (H) 0 - 44 U/L   Alkaline Phosphatase 67 38 - 126 U/L   Total Bilirubin 0.8 0.3 - 1.2 mg/dL   GFR calc non Af Amer >60 >60 mL/min   GFR calc Af Amer >60 >60 mL/min   Anion gap 11 5 - 15    Comment: Performed at [facility], [address], [place], [place] [address]  Magnesium     Status: None   Collection Time: 06/21/19  4:05 AM  Result Value Ref Range   Magnesium 2.2 1.7 - 2.4 mg/dL    Comment: Performed at  [facility], [address], [place], [place] [address]  D-dimer, quantitative (not at [facility])     Status: Abnormal   Collection Time: 06/21/19  4:05 AM  Result Value Ref Range   D-Dimer, Quant >20.00 (H) 0.00 - 0.50 ug/mL-FEU    Comment: (NOTE) At the manufacturer cut-off of 0.50 ug/mL FEU, this assay has been documented to exclude PE with a sensitivity and negative predictive value of 97 to 99%.  At this time, this assay has not been approved by the FDA to exclude DVT/VTE. Results should be correlated with clinical presentation. Performed at [facility], [address], [place], [place] [address]   C-reactive protein     Status: Abnormal   Collection Time: 06/21/19  4:05 AM  Result Value Ref Range   CRP 7.2 (H) <1.0 mg/dL    Comment: Performed at [facility], [address], [place], [place] [address]  Heparin level (unfractionated)     Status: None   Collection Time: 06/21/19  4:05 AM  Result Value Ref Range   Heparin Unfractionated 0.37 0.30 - 0.70 IU/mL    Comment: (NOTE) If heparin results are below expected values, and patient dosage has  been confirmed, suggest follow up testing of antithrombin III levels. Performed at [facility], [address], [place], [place] [address]   Glucose, capillary     Status: Abnormal   Collection Time: 06/21/19  7:28 AM  Result Value Ref Range   Glucose-Capillary 190 (H) 70 - 99 mg/dL  Glucose, capillary     Status: Abnormal   Collection Time: 06/21/19 11:05 AM  Result Value Ref Range   Glucose-Capillary 263 (H) 70 - 99 mg/dL  Glucose, capillary     Status: Abnormal   Collection Time: 06/21/19  4:36 PM  Result Value Ref Range   Glucose-Capillary 237 (H) 70 - 99 mg/dL  Glucose, capillary     Status: Abnormal   Collection Time: 06/21/19 10:15 PM  Result Value Ref Range   Glucose-Capillary 195 (H) 70 - 99 mg/dL  BNP daily     Status: Abnormal   Collection  Time: 06/22/19  3:30 AM  Result Value Ref Range   B Natriuretic Peptide 185.4 (H) 0.0 - 100.0 pg/mL    Comment: Performed at [facility], [address], [place], [place] [address]  CBC with Differential/Platelet     Status: Abnormal   Collection Time: 06/22/19  3:30 AM  Result Value Ref Range   WBC 26.0 (H) 4.0 -  10.5 K/uL   RBC 4.83 4.22 - 5.81 MIL/uL   Hemoglobin 13.2 13.0 - 17.0 g/dL   HCT 16.1 09.6 - 04.5 %   MCV 85.9 80.0 - 100.0 fL   MCH 27.3 26.0 - 34.0 pg   MCHC 31.8 30.0 - 36.0 g/dL   RDW 40.9 81.1 - 91.4 %   Platelets 285 150 - 400 K/uL   nRBC 0.3 (H) 0.0 - 0.2 %   Neutrophils Relative % 77 %   Neutro Abs 20.0 (H) 1.7 - 7.7 K/uL   Lymphocytes Relative 8 %   Lymphs Abs 2.2 0.7 - 4.0 K/uL   Monocytes Relative 7 %   Monocytes Absolute 1.8 (H) 0.1 - 1.0 K/uL   Eosinophils Relative 0 %   Eosinophils Absolute 0.0 0.0 - 0.5 K/uL   Basophils Relative 1 %   Basophils Absolute 0.2 (H) 0.0 - 0.1 K/uL   Immature Granulocytes 7 %   Abs Immature Granulocytes 1.83 (H) 0.00 - 0.07 K/uL    Comment: Performed at [facility], [address] [place], [place] [address]  Comprehensive metabolic panel     Status: Abnormal   Collection Time: 06/22/19  3:30 AM  Result Value Ref Range   Sodium 139 135 - 145 mmol/L   Potassium 4.9 3.5 - 5.1 mmol/L   Chloride 108 98 - 111 mmol/L   CO2 19 (L) 22 - 32 mmol/L   Glucose, Bld 192 (H) 70 - 99 mg/dL   BUN 31 (H) 6 - 20 mg/dL   Creatinine, Ser 6.21 0.61 - 1.24 mg/dL   Calcium 8.2 (L) 8.9 - 10.3 mg/dL   Total Protein 6.2 (L) 6.5 - 8.1 g/dL   Albumin 2.7 (L) 3.5 - 5.0 g/dL   AST 42 (H) 15 - 41 U/L   ALT 71 (H) 0 - 44 U/L   Alkaline Phosphatase 65 38 - 126 U/L   Total Bilirubin 0.8 0.3 - 1.2 mg/dL   GFR calc non Af Amer >60 >60 mL/min   GFR calc Af Amer >60 >60 mL/min   Anion gap 12 5 - 15    Comment: Performed at [facility], [address] [place], [place] [address]  Magnesium      Status: None   Collection Time: 06/22/19  3:30 AM  Result Value Ref Range   Magnesium 2.1 1.7 - 2.4 mg/dL    Comment: Performed at [facility], [address], [place], [place] [address]  D-dimer, quantitative (not at [facility])     Status: Abnormal   Collection Time: 06/22/19  3:30 AM  Result Value Ref Range   D-Dimer, Quant 19.02 (H) 0.00 - 0.50 ug/mL-FEU    Comment: (NOTE) At the manufacturer cut-off of 0.50 ug/mL FEU, this assay has been documented to exclude PE with a sensitivity and negative predictive value of 97 to 99%.  At this time, this assay has not been approved by the FDA to exclude DVT/VTE. Results should be correlated with clinical presentation. Performed at [facility], [address], [place], [place] [address]   C-reactive protein     Status: Abnormal   Collection Time: 06/22/19  3:30 AM  Result Value Ref Range   CRP 4.0 (H) <1.0 mg/dL    Comment: Performed at [facility], [address] [place], [place] [address]  Glucose, capillary     Status: Abnormal   Collection Time: 06/22/19  8:07 AM  Result Value Ref Range  Glucose-Capillary 198 (H) 70 - 99 mg/dL  Glucose, capillary     Status: Abnormal   Collection Time: 06/22/19 11:36 AM  Result Value Ref Range   Glucose-Capillary 243 (H) 70 - 99 mg/dL  Cardiolipin antibodies, IgG, IgM, IgA     Status: None   Collection Time: 07/18/19  2:47 PM  Result Value Ref Range   Anticardiolipin IgG <9 0 - 14 GPL U/mL    Comment: (NOTE)                          Negative:              <15                          Indeterminate:     15 - 20                          Low-Med Positive: >20 - 80                          High Positive:         >80    Anticardiolipin IgM <9 0 - 12 MPL U/mL    Comment: (NOTE)                          Negative:              <13                          Indeterminate:     13 - 20                          Low-Med Positive: >20 - 80                           High Positive:         >80    Anticardiolipin IgA <9 0 - 11 APL U/mL    Comment: (NOTE)                          Negative:              <12                          Indeterminate:     12 - 20                          Low-Med Positive: >20 - 80                          High Positive:         >80 Performed At: [facility] [address] [place], [place] [id number] Jolene Schimke MD [id number]   Lupus anticoagulant panel     Status: Abnormal   Collection Time: 07/18/19  2:47 PM  Result Value Ref Range   PTT Lupus Anticoagulant 43.5 0.0 - 51.9 sec   DRVVT 64.0 (H) 0.0 - 47.0 sec   Lupus Anticoag Interp Comment:     Comment: (NOTE) Results are consistent  with the presence of a lupus anticoagulant. As only persistent lupus anticoagulant (LA) positivity meets laboratory diagnostic criteria for antiphospholipid syndrome, repeat testing in 12 or more weeks is recommended, ideally in the absence of anticoagulant therapy. Important Note: The results of LA testing are not valid for patients receiving heparin, direct Xa inhibitor (e.g., rivaroxaban, apixaban) or direct thrombin inhibitor (e.g., dabigatran) therapy. These drugs may cause false positive LA results but will not interfere with anticardiolipin and beta-2 glycoprotein 1 antibody testing. Performed At: [facility] [address] [place], [place] [id number] Jolene Schimke MD [id number]   Prothrombin gene mutation     Status: None   Collection Time: 07/18/19  2:47 PM  Result Value Ref Range   Recommendations-PTGENE: Comment     Comment: (NOTE) NEGATIVE No mutation identified. Comment: A point mutation (G20210A) in the factor II (prothrombin) gene is the second most common cause of inherited thrombophilia. The incidence of this mutation in the [place] Caucasian population is about 2% and in the [place] American population it is approximately 0.5%. This mutation is rare in the [place] and  Native American population. Being heterozygous for a prothrombin mutation increases the risk for developing venous thrombosis about 2 to 3 times above the general population risk. Being homozygous for the prothrombin gene mutation increases the relative risk for venous thrombosis further, although it is not yet known how much further the risk is increased. In women heterozygous for the prothrombin gene mutation, the use of estrogen containing oral contraceptives increases the relative risk of venous thrombosis about 16 times and the risk of developing cerebral thrombosis is also significantly increased. In pregnancy the pr othrombin gene mutation increases risk for venous thrombosis and may increase risk for stillbirth, placental abruption, pre-eclampsia and fetal growth restriction. If the patient possesses two or more congenital or acquired thrombophilic risk factors, the risk for thrombosis may rise to more than the sum of the risk ratios for the individual mutations. This assay detects only the prothrombin G20210A mutation and does not measure genetic abnormalities elsewhere in the genome. Other thrombotic risk factors may be pursued through systematic clinical laboratory analysis. These factors include the R506Q (Leiden) mutation in the Factor V gene, plasma homocysteine levels, as well as testing for deficiencies of antithrombin III, protein C and protein S. Genetic Counselors are available for health care providers to discuss results at [phone]). Methodology: DNA analysis of the Factor II gene was performed by PCR amplification followed by restriction analysis. The di agnostic sensitivity is >99% for both. All the tests must be combined with clinical information for the most accurate interpretation. Molecular-based testing is highly accurate, but as in any laboratory test, diagnostic errors may occur. This test was developed and its performance  characteristics determined by [facility]. It has not been cleared or approved by the Food and Drug Administration. Poort SR, et al. Blood. 1996; 17:4081-4481. Varga EA. Circulation. 2004; 110:e15-e18. Marland Kitchen, et al. Arterioscler Thromb Vasc Biol. [phone]. Vincenza Hews, PhD, [facility] Ernestene Mention, PhD, [facility] Gillian Shields, PhD, [facility] Manya Silvas, PhD, [facility] Bonnielee Haff, PhD, [facility] Alpha Gula, PhD, [facility] Performed At: [facility] [address] [place], [place] [id number] Maurine Simmering MDPhD [id number]   Factor 5 leiden     Status: None   Collection Time: 07/18/19  2:47 PM  Result Value Ref Range   Recommendations-F5LEID: Comment     Comment: (NOTE) Result:  Negative (no mutation found) Factor V Leiden is a specific mutation (  R506Q) in the factor V gene that is associated with an increased risk of venous thrombosis. Factor V Leiden is more resistant to inactivation by activated protein C.  As a result, factor V persists in the circulation leading to a mild hyper- coagulable state.  The Leiden mutation accounts for 90% - 95% of APC resistance.  Factor V Leiden has been reported in patients with deep vein thrombosis, pulmonary embolus, central retinal vein occlusion, cerebral sinus thrombosis and hepatic vein thrombosis. Other risk factors to be considered in the workup for venous thrombosis include the G20210A mutation in the factor II (prothrombin) gene, protein S and C deficiency, and antithrombin deficiencies. Anticardiolipin antibody and lupus anticoagulant analysis may be appropriate for certain patients, as well as homocysteine levels. Contact your local [facility] for information on how to order additi onal testing if desired. **Genetic counselors are available for health care providers to**  discuss results at [phone]). Methodology: DNA analysis of the Factor V gene was performed by allele-specific PCR. The diagnostic  sensitivity and specificity is >99% for both. Molecular-based testing is highly accurate, but as in any laboratory test, diagnostic errors may occur. All test results must be combined with clinical information for the most accurate interpretation. This test was developed and its performance characteristics determined by [facility]. It has not been cleared or approved by the Food and Drug Administration. References: Voelkerding K (1996).  Clin Lab Med [phone]. Vincenza Hews, PhD, [facility] Ernestene Mention, PhD, [facility] Gillian Shields, PhD, [facility] Manya Silvas, PhD, [facility] Bonnielee Haff, PhD, [facility] Alpha Gula PhD, [facility] Performed At: [facility] [address] [place], [place] [id number] Maurine Simmering MDPhD [id number]   Comprehensive metabolic panel     Status: Abnormal   Collection Time: 07/18/19  2:47 PM  Result Value Ref Range   Sodium 138 135 - 145 mmol/L   Potassium 3.5 3.5 - 5.1 mmol/L   Chloride 103 98 - 111 mmol/L   CO2 23 22 - 32 mmol/L   Glucose, Bld 95 70 - 99 mg/dL   BUN 7 6 - 20 mg/dL   Creatinine, Ser 4.69 0.61 - 1.24 mg/dL   Calcium 8.8 (L) 8.9 - 10.3 mg/dL   Total Protein 7.5 6.5 - 8.1 g/dL   Albumin 3.8 3.5 - 5.0 g/dL   AST 19 15 - 41 U/L   ALT 24 0 - 44 U/L   Alkaline Phosphatase 68 38 - 126 U/L   Total Bilirubin 0.8 0.3 - 1.2 mg/dL   GFR calc non Af Amer >60 >60 mL/min   GFR calc Af Amer >60 >60 mL/min   Anion gap 12 5 - 15    Comment: Performed at [facility], [address], [place], [place] [address]  CBC with Differential/Platelet     Status: Abnormal   Collection Time: 07/18/19  2:47 PM  Result Value Ref Range   WBC 7.3 4.0 - 10.5 K/uL   RBC 4.66 4.22 - 5.81 MIL/uL   Hemoglobin 12.9 (L) 13.0 - 17.0 g/dL   HCT 84.1 32.4 - 40.1 %   MCV 84.8 80.0 - 100.0 fL   MCH 27.7 26.0 - 34.0 pg   MCHC 32.7 30.0 - 36.0 g/dL   RDW 02.7 25.3 - 66.4 %   Platelets 352 150 - 400 K/uL   nRBC 0.0 0.0 - 0.2 %   Neutrophils Relative % 56 %    Neutro Abs 4.0 1.7 - 7.7 K/uL   Lymphocytes Relative 32 %  Lymphs Abs 2.3 0.7 - 4.0 K/uL   Monocytes Relative 10 %   Monocytes Absolute 0.8 0.1 - 1.0 K/uL   Eosinophils Relative 2 %   Eosinophils Absolute 0.1 0.0 - 0.5 K/uL   Basophils Relative 0 %   Basophils Absolute 0.0 0.0 - 0.1 K/uL   Immature Granulocytes 0 %   Abs Immature Granulocytes 0.02 0.00 - 0.07 K/uL    Comment: Performed at [facility], [address], [place], [place] [address]  dRVVT Mix     Status: Abnormal   Collection Time: 07/18/19  2:47 PM  Result Value Ref Range   dRVVT Mix 52.9 (H) 0.0 - 40.4 sec    Comment: (NOTE)              **Please note reference interval change** Performed At: [facility] [address] [place], [place] [id number] Jolene Schimke MD [id number]:[id number]   dRVVT Confirm     Status: Abnormal   Collection Time: 07/18/19  2:47 PM  Result Value Ref Range   dRVVT Confirm 1.3 (H) 0.8 - 1.2 ratio    Comment: (NOTE) Performed At: [facility] [address] [place], [place] [id number] Jolene Schimke MD [id number]     Assessment/Plan:  Type 2 diabetes mellitus without complication, without long-term current use of insulin (HCC) blood glucose control important in reducing the progression of atherosclerotic disease. Also, involved in wound healing. On appropriate medications.   Benign essential HTN blood pressure control important in reducing the progression of atherosclerotic disease. On appropriate oral medications.   Bilateral pulmonary embolism (HCC) The patient developed pulmonary embolus from a Covid infection about 6 weeks ago. He continues on anticoagulation and seems to be tolerating this well. This was clearly a provoked event but he would still require at least 6 months of anticoagulation and if the hematologist feel like longer would be of benefit, I will defer that to their judgment.  DVT (deep venous thrombosis) (HCC) The patient had a provoked  DVT and PE with Covid infection about 6 weeks ago. At this point, he has maintain anticoagulation and he is doing well. He does have some postphlebitic symptoms that are noticeable. I have strongly recommended he begin wearing 20 to 30 mmHg compression stocking daily. I have discussed the role of leg elevation and maintaining activity to benefit him and reduce his postphlebitic symptoms long-term.  He already has some postphlebitic symptoms and although I do not know the extent of the DVT, to had a pulmonary embolus and the swelling and he has had I suspect it was fairly substantial.  I will plan to see him back in about 4 months with a DVT study.  He will contact our office with any problems in the interim.      Festus Barren 07/31/2019, 1:12 PM   This note was created with Dragon medical transcription system.  Any errors from dictation are unintentional.

## 2019-07-31 NOTE — Assessment & Plan Note (Signed)
The patient developed pulmonary embolus from a Covid infection about 6 weeks ago. He continues on anticoagulation and seems to be tolerating this well. This was clearly a provoked event but he would still require at least 6 months of anticoagulation and if the hematologist feel like longer would be of benefit, I will defer that to their judgment.

## 2019-07-31 NOTE — Progress Notes (Signed)
[facility]  [address], [address] [place], [place] [address] Phone: [phone]  Fax: [phone]   Telemedicine Office Visit:  08/02/2019  Referring physician: Jerrilyn [place], [facility]  I connected with Austin Howell on 08/02/2019 at 2:00 PM by videoconferencing and verified that I was speaking with the correct person using 2 identifiers.  The patient was at his fiance's home.  I discussed the limitations, risk, security and privacy concerns of performing an evaluation and management service by videoconferencing and the availability of in person appointments.  I also discussed with the patient that there may be a patient responsible charge related to this service.  The patient expressed understanding and agreed to proceed.   Chief Complaint: Austin Howell is a 54 y.o. male with bilateral pulmonary emboli who is seen for 2 week assessment.  HPI: The patient was last seen in the hematology clinic on 07/18/2019 for initial consult. At that time, he felt good. He was off oxygen. He denied any shortness of breath. His right leg felt "heavier" and had some edema. Patient continued Xarelto 20 mg a day.   Work up revealed hematocrit 39.5, hemoglobin 12.9, MCV 84.8, platelets 325,000, WBC 7,300.  Prothrombin gene mutation and Factor V Leiden were negative.  Lupus anticoagulant panel was positive on Xarelto (can interfere with testing).  Cardiolipin antibodies were negative. Beta-2-glycoprotein antibodies (pending).   Bilateral lower extremity doppler venous study performed in [place], [place] on 06/20/2019 revealed acute DVT involving the right femoral vein, right proximal profunda vein, right popliteal vein, and the right posterior tibial veins. There was acute DVT involving the left popliteal vein.    He was seen by Dr Wyn Quakerew in vascular surgery on 07/31/2019.  Recommendation was for at least 6 months of anticoagulation.  Leg elevation and maintaining activity were recommended to  reduce postphlebitic syndrome.  During the interim, he has done well. He remains on Xarelto. He denies bleeding. He is wearing compression socks which has improved his leg swelling. He is ready to go back to work but is unsure of the date.    Past Medical History:  Diagnosis Date  . High cholesterol   . History of blood clots   . Hypertension     Past Surgical History:  Procedure Laterality Date  . NO PAST SURGERIES      Family History  Problem Relation Age of Onset  . Cervical cancer Mother   . Lung cancer Father   . Cervical cancer Sister     Social History:  reports that he has never smoked. He has never used smokeless tobacco. He reports that he does not drink alcohol or use drugs. He denies any exposure to radiations of toxins. He is working 7 days/week at [employer] in [place].  At work he wears steel toe boots and walks on concrete floors. He has 3 daughters.  Patient lives in [place], [place]. The patient is alone today.  Participants in the patient's visit and their role in the encounter included the patient and Delano Metzesalea Shropshire, RN, today.  The intake visit was provided by Delano Metzesalea Shropshire, RN.  Allergies: No Known Allergies  Current Medications: Current Outpatient Medications  Medication Sig Dispense Refill  . acetaminophen (TYLENOL) 500 MG tablet Take 1,000 mg by mouth every 6 (six) hours as needed for fever.    Marland Kitchen. atorvastatin (LIPITOR) 10 MG tablet Take 10 mg by mouth daily at 6 PM.     . lisinopril-hydrochlorothiazide (ZESTORETIC) 20-12.5 MG tablet Take 1 tablet  by mouth daily.     . rivaroxaban (XARELTO) 20 MG TABS tablet Take 1 tablet (20 mg total) by mouth daily with supper. START TAKING ONLY AFTER YOU FINISH THE STARTER PACK 30 tablet 0  . sildenafil (VIAGRA) 25 MG tablet Take 25 mg by mouth daily as needed.    Marland Kitchen albuterol (VENTOLIN HFA) 108 (90 Base) MCG/ACT inhaler Inhale 2 puffs into the lungs every 6 (six) hours as needed for wheezing or shortness of breath.  (Patient not taking: Reported on 07/18/2019) 6.7 g 0   No current facility-administered medications for this visit.    Review of Systems  Constitutional: Negative.  Negative for chills, diaphoresis, fever, malaise/fatigue and weight loss.       Doing well.  HENT: Negative for congestion, ear pain, hearing loss, nosebleeds, sinus pain and sore throat.        Rhinorrhea in the mornings.  Eyes: Negative.  Negative for blurred vision, double vision and photophobia.  Respiratory: Negative.  Negative for cough, hemoptysis, sputum production, shortness of breath and wheezing.   Cardiovascular: Positive for leg swelling (right leg feels heavier than left leg; improving with compression socks). Negative for chest pain, palpitations and orthopnea.  Gastrointestinal: Negative for abdominal pain, blood in stool, constipation, diarrhea, melena, nausea and vomiting.       No prior colonoscopy.  Genitourinary: Negative.  Negative for dysuria, frequency, hematuria and urgency.  Musculoskeletal: Negative.  Negative for back pain, joint pain, myalgias and neck pain.  Skin: Negative.  Negative for itching and rash.  Neurological: Negative.  Negative for dizziness, tingling, sensory change, weakness and headaches.  Endo/Heme/Allergies: Does not bruise/bleed easily.       Diabetes.  Psychiatric/Behavioral: Negative.  Negative for depression and memory loss. The patient is not nervous/anxious and does not have insomnia.   All other systems reviewed and are negative.  Performance status (ECOG): 0  Physical Exam  Constitutional: He is oriented to person, place, and time. He appears well-developed and well-nourished. No distress.  HENT:  Head: Normocephalic and atraumatic.  Short black hair.  Eyes: Conjunctivae and EOM are normal. No scleral icterus.  Neurological: He is alert and oriented to person, place, and time.  Skin: He is not diaphoretic.  Psychiatric: He has a normal mood and affect. His behavior  is normal. Judgment and thought content normal.  Nursing note reviewed.   No visits with results within 3 Day(s) from this visit.  Latest known visit with results is:  Orders Only on 07/18/2019  Component Date Value Ref Range Status  . Anticardiolipin IgG 07/18/2019 <9  0 - 14 GPL U/mL Final   Comment: (NOTE)                          Negative:              <15                          Indeterminate:     15 - 20                          Low-Med Positive: >20 - 80                          High Positive:         >80   . Anticardiolipin IgM 07/18/2019 <9  0 - 12 MPL U/mL Final   Comment: (NOTE)                          Negative:              <13                          Indeterminate:     13 - 20                          Low-Med Positive: >20 - 80                          High Positive:         >80   . Anticardiolipin IgA 07/18/2019 <9  0 - 11 APL U/mL Final   Comment: (NOTE)                          Negative:              <12                          Indeterminate:     12 - 20                          Low-Med Positive: >20 - 80                          High Positive:         >80 Performed At: [facility] [address] [place], [place] [id number] Jolene Schimke MD XN:[id number]   . PTT Lupus Anticoagulant 07/18/2019 43.5  0.0 - 51.9 sec Final  . DRVVT 07/18/2019 64.0* 0.0 - 47.0 sec Final  . Lupus Anticoag Interp 07/18/2019 Comment:   Corrected   Comment: (NOTE) Results are consistent with the presence of a lupus anticoagulant. As only persistent lupus anticoagulant (LA) positivity meets laboratory diagnostic criteria for antiphospholipid syndrome, repeat testing in 12 or more weeks is recommended, ideally in the absence of anticoagulant therapy. Important Note: The results of LA testing are not valid for patients receiving heparin, direct Xa inhibitor (e.g., rivaroxaban, apixaban) or direct thrombin inhibitor (e.g., dabigatran) therapy. These drugs  may cause false positive LA results but will not interfere with anticardiolipin and beta-2 glycoprotein 1 antibody testing. Performed At: [facility] [address] [place], [place] [id number] Jolene Schimke MD GY:[id number]   . Recommendations-PTGENE: 07/18/2019 Comment   Final   Comment: (NOTE) NEGATIVE No mutation identified. Comment: A point mutation (G20210A) in the factor II (prothrombin) gene is the second most common cause of inherited thrombophilia. The incidence of this mutation in the [place] Caucasian population is about 2% and in the [place] American population it is approximately 0.5%. This mutation is rare in the [place] and Native American population. Being heterozygous for a prothrombin mutation increases the risk for developing venous thrombosis about 2 to 3 times above the general population risk. Being homozygous for the prothrombin gene mutation increases the relative risk for venous thrombosis further, although it is not yet known how much further the risk is increased. In women heterozygous for the prothrombin gene mutation, the use of estrogen containing oral contraceptives increases  the relative risk of venous thrombosis about 16 times and the risk of developing cerebral thrombosis is also significantly increased. In pregnancy the pr                          othrombin gene mutation increases risk for venous thrombosis and may increase risk for stillbirth, placental abruption, pre-eclampsia and fetal growth restriction. If the patient possesses two or more congenital or acquired thrombophilic risk factors, the risk for thrombosis may rise to more than the sum of the risk ratios for the individual mutations. This assay detects only the prothrombin G20210A mutation and does not measure genetic abnormalities elsewhere in the genome. Other thrombotic risk factors may be pursued through systematic clinical laboratory analysis. These factors include  the R506Q (Leiden) mutation in the Factor V gene, plasma homocysteine levels, as well as testing for deficiencies of antithrombin III, protein C and protein S. Genetic Counselors are available for health care providers to discuss results at 1-800-345-GENE [phone] Methodology: DNA analysis of the Factor II gene was performed by PCR amplification followed by restriction analysis. The di                          agnostic sensitivity is >99% for both. All the tests must be combined with clinical information for the most accurate interpretation. Molecular-based testing is highly accurate, but as in any laboratory test, diagnostic errors may occur. This test was developed and its performance characteristics determined by [facility]. It has not been cleared or approved by the Food and Drug Administration. Poort SR, et al. Blood. 1996; 13:0865-7846; 88:3698-3703. Varga EA. Circulation. 2004; 110:e15-e18. Marland KitchenMartinelli I, et al. Arterioscler Thromb Vasc Biol. [phone]; 19:700-703. Vincenza Hewshevonne Eversley, PhD, [facility] Ernestene MentionMelissa A Hayden, PhD, [facility] Gillian ShieldsW. Christine Spence, PhD, [facility] Manya SilvasAlecia Willis, PhD, [facility] Bonnielee HaffHongli Zhan, PhD, [facility] Alpha GulaJoseph B Kearney, PhD, [facility] Performed At: [facility] [address] [place], [place] [id number] Maurine Simmeringhenn Anjen MDPhD GU:[id number]:[phone]   . Recommendations-F5LEID: 07/18/2019 Comment   Final   Comment: (NOTE) Result:  Negative (no mutation found) Factor V Leiden is a specific mutation (R506Q) in the factor V gene that is associated with an increased risk of venous thrombosis. Factor V Leiden is more resistant to inactivation by activated protein C.  As a result, factor V persists in the circulation leading to a mild hyper- coagulable state.  The Leiden mutation accounts for 90% - 95% of APC resistance.  Factor V Leiden has been reported in patients with deep vein thrombosis, pulmonary embolus, central retinal vein occlusion, cerebral sinus thrombosis and hepatic vein thrombosis. Other risk  factors to be considered in the workup for venous thrombosis include the G20210A mutation in the factor II (prothrombin) gene, protein S and C deficiency, and antithrombin deficiencies. Anticardiolipin antibody and lupus anticoagulant analysis may be appropriate for certain patients, as well as homocysteine levels. Contact your local [facility] for information on how to order additi                          onal testing if desired. **Genetic counselors are available for health care providers to**  discuss results at 1-800-345-GENE [phone]). Methodology: DNA analysis of the Factor V gene was performed by allele-specific PCR. The diagnostic sensitivity and specificity is >99% for both. Molecular-based testing is highly accurate, but as in any laboratory test, diagnostic errors may occur. All test results must be combined with  clinical information for the most accurate interpretation. This test was developed and its performance characteristics determined by [facility]. It has not been cleared or approved by the Food and Drug Administration. References: Voelkerding K (1996).  Clin Lab Med [phone]. Vincenza Hews, PhD, [facility] Ernestene Mention, PhD, [facility] Gillian Shields, PhD, [facility] Manya Silvas, PhD, [facility] Bonnielee Haff, PhD, [facility] Alpha Gula PhD, [facility] Performed At: [facility] [address] [place], [place] [id number] Maurine Simmering MDPhD KJ:[id number]   . Sodium 07/18/2019 138  135 - 145 mmol/L Final  . Potassium 07/18/2019 3.5  3.5 - 5.1 mmol/L Final  . Chloride 07/18/2019 103  98 - 111 mmol/L Final  . CO2 07/18/2019 23  22 - 32 mmol/L Final  . Glucose, Bld 07/18/2019 95  70 - 99 mg/dL Final  . BUN 94/80/1655 7  6 - 20 mg/dL Final  . Creatinine, Ser 07/18/2019 0.78  0.61 - 1.24 mg/dL Final  . Calcium 37/48/2707 8.8* 8.9 - 10.3 mg/dL Final  . Total Protein 07/18/2019 7.5  6.5 - 8.1 g/dL Final  . Albumin 86/75/4492 3.8  3.5 - 5.0 g/dL Final  . AST 01/00/7121 19  15 - 41  U/L Final  . ALT 07/18/2019 24  0 - 44 U/L Final  . Alkaline Phosphatase 07/18/2019 68  38 - 126 U/L Final  . Total Bilirubin 07/18/2019 0.8  0.3 - 1.2 mg/dL Final  . GFR calc non Af Amer 07/18/2019 >60  >60 mL/min Final  . GFR calc Af Amer 07/18/2019 >60  >60 mL/min Final  . Anion gap 07/18/2019 12  5 - 15 Final   Performed at [facility], [address] [place], [place] [address]  . WBC 07/18/2019 7.3  4.0 - 10.5 K/uL Final  . RBC 07/18/2019 4.66  4.22 - 5.81 MIL/uL Final  . Hemoglobin 07/18/2019 12.9* 13.0 - 17.0 g/dL Final  . HCT 32/54/9826 39.5  39.0 - 52.0 % Final  . MCV 07/18/2019 84.8  80.0 - 100.0 fL Final  . MCH 07/18/2019 27.7  26.0 - 34.0 pg Final  . MCHC 07/18/2019 32.7  30.0 - 36.0 g/dL Final  . RDW 41/58/3094 14.6  11.5 - 15.5 % Final  . Platelets 07/18/2019 352  150 - 400 K/uL Final  . nRBC 07/18/2019 0.0  0.0 - 0.2 % Final  . Neutrophils Relative % 07/18/2019 56  % Final  . Neutro Abs 07/18/2019 4.0  1.7 - 7.7 K/uL Final  . Lymphocytes Relative 07/18/2019 32  % Final  . Lymphs Abs 07/18/2019 2.3  0.7 - 4.0 K/uL Final  . Monocytes Relative 07/18/2019 10  % Final  . Monocytes Absolute 07/18/2019 0.8  0.1 - 1.0 K/uL Final  . Eosinophils Relative 07/18/2019 2  % Final  . Eosinophils Absolute 07/18/2019 0.1  0.0 - 0.5 K/uL Final  . Basophils Relative 07/18/2019 0  % Final  . Basophils Absolute 07/18/2019 0.0  0.0 - 0.1 K/uL Final  . Immature Granulocytes 07/18/2019 0  % Final  . Abs Immature Granulocytes 07/18/2019 0.02  0.00 - 0.07 K/uL Final   Performed at [facility], [address], [place], [place] [address]  . dRVVT Mix 07/18/2019 52.9* 0.0 - 40.4 sec Final   Comment: (NOTE)              **Please note reference interval change** Performed At: [facility] [address] [place], [place] [id number] Jolene Schimke MD YV:[id number]   . dRVVT Confirm 07/18/2019  1.3* 0.8 - 1.2 ratio Final   Comment: (NOTE) Performed At: [facility] [place], [place] [id number] Rush Farmer MD LG:[id number]     Assessment:  Austin Howell is a 54 y.o. male with multiple pulmonary emboli following COVID-19 diagnosis on 06/09/2019.  He presented with dehydration, acute hypoxic respiratory failure, and acute kidney injury.  Creatinine was 2.79 (baseline 1.12).  He is on Xarelto.  VQ scan on 06/19/2019 revealed multiple bilateral linear and wedge-shaped perfusion defects constituting High Probability of acute pulmonary embolism.  Bilateral lower extremity duplex in [place], [place] on 06/20/2019 revealed acute DVT involving the right femoral vein, right proximal profunda vein, right popliteal vein, and the right posterior tibial veins. There was acute DVT involving the left popliteal vein.  Echo on 06/20/2019 revealed an EF of 60-65%. There was mildly increased LVH.  The right atrium was severely dilated and the right ventricle mildly enlarged.   Work up on 07/18/2019 revealed hematocrit 39.5, hemoglobin 12.9, MCV 84.8, platelets 325,000, WBC 7,300.   Prothrombin gene mutation and Factor V Leiden were negative.  Lupus anticoagulant panel was positive on Xarelto (can interfere with testing).  Cardiolipin antibodies were negative.    Symptomatically, he is doing well on Xarelto.  He denies any bleeding.  Plan: 1.   Review work up from 07/18/2019. 2.   Bilateral pulmonary emboli             Etiology felt secondary to COVID-19 and dehydration.             He has no family history of thrombosis.             Review interval vascular surgery consultation.             Review hypercoagulable work-up- unclear significance of + lupus anticaogulant on Xarelto.   Discuss plan for repeat lupus anticoagulant panel in future when off Xarelto.   Follow-up beta 2-glycoprotein antibodies.             Review plan for 6 months of anticoagulation unless patient has a + lupus anticoagulant on retesting.             Continue Xarelto  20 mg a day. 3.   RTC in 3 months for MD assessment and labs (CBC with diff, CMP, protein C activity and antigen, protein S activity and antigen, anti-thrombin III activity and antigen).  I discussed the assessment and treatment plan with the patient.  The patient was provided an opportunity to ask questions and all were answered.  The patient agreed with the plan and demonstrated an understanding of the instructions.  The patient was advised to call back if the symptoms worsen or if the condition fails to improve as anticipated.   Lequita Asal, MD, PhD    08/02/2019, 2:00 PM  I, Selena Batten, am acting as scribe for [employer]. Mike Gip, MD, PhD.  I, Gardenia Witter C. Mike Gip, MD, have reviewed the above documentation for accuracy and completeness, and I agree with the above.

## 2019-07-31 NOTE — Assessment & Plan Note (Signed)
The patient had a provoked DVT and PE with Covid infection about 6 weeks ago. At this point, he has maintain anticoagulation and he is doing well. He does have some postphlebitic symptoms that are noticeable. I have strongly recommended he begin wearing 20 to 30 mmHg compression stocking daily. I have discussed the role of leg elevation and maintaining activity to benefit him and reduce his postphlebitic symptoms long-term.  He already has some postphlebitic symptoms and although I do not know the extent of the DVT, to had a pulmonary embolus and the swelling and he has had I suspect it was fairly substantial.  I will plan to see him back in about 4 months with a DVT study.  He will contact our office with any problems in the interim.

## 2019-07-31 NOTE — Assessment & Plan Note (Signed)
blood glucose control important in reducing the progression of atherosclerotic disease. Also, involved in wound healing. On appropriate medications.  

## 2019-07-31 NOTE — Assessment & Plan Note (Signed)
blood pressure control important in reducing the progression of atherosclerotic disease. On appropriate oral medications.  

## 2019-07-31 NOTE — Patient Instructions (Signed)

## 2019-08-02 ENCOUNTER — Inpatient Hospital Stay: Payer: BC Managed Care – PPO | Attending: Hematology and Oncology | Admitting: Hematology and Oncology

## 2019-08-02 ENCOUNTER — Encounter: Payer: Self-pay | Admitting: Hematology and Oncology

## 2019-08-02 DIAGNOSIS — I10 Essential (primary) hypertension: Secondary | ICD-10-CM

## 2019-08-02 DIAGNOSIS — Z7901 Long term (current) use of anticoagulants: Secondary | ICD-10-CM

## 2019-08-02 DIAGNOSIS — I2699 Other pulmonary embolism without acute cor pulmonale: Secondary | ICD-10-CM

## 2019-08-02 DIAGNOSIS — Z79899 Other long term (current) drug therapy: Secondary | ICD-10-CM

## 2019-08-02 DIAGNOSIS — R600 Localized edema: Secondary | ICD-10-CM

## 2019-08-02 DIAGNOSIS — E785 Hyperlipidemia, unspecified: Secondary | ICD-10-CM

## 2019-08-02 NOTE — Progress Notes (Signed)
Confirmed Name and DOB. Denies any concerns.  

## 2019-08-27 ENCOUNTER — Telehealth: Payer: Self-pay

## 2019-08-27 ENCOUNTER — Other Ambulatory Visit: Payer: Self-pay

## 2019-08-27 DIAGNOSIS — I824Y1 Acute embolism and thrombosis of unspecified deep veins of right proximal lower extremity: Secondary | ICD-10-CM

## 2019-08-27 NOTE — Telephone Encounter (Signed)
I reached out to the lab the patient still have pending labs for Betaglycoprotein from 07/18/2019. Ms cathy in the lab states she will look into it to see what has happen and will give me a call back. I will be waiting on the call back.

## 2019-10-26 ENCOUNTER — Ambulatory Visit: Payer: BC Managed Care – PPO | Attending: Internal Medicine

## 2019-10-26 DIAGNOSIS — Z23 Encounter for immunization: Secondary | ICD-10-CM

## 2019-10-26 NOTE — Progress Notes (Signed)
   Covid-19 Vaccination Clinic  Name:  Austin Howell    MRN: [id number] DOB: 1966/05/25  10/26/2019  [name] was observed post Covid-19 immunization for 15 minutes without incident. He was provided with Vaccine Information Sheet and instruction to access the V-Safe system.   [name] was instructed to call 911 with any severe reactions post vaccine: Marland Kitchen Difficulty breathing  . Swelling of face and throat  . A fast heartbeat  . A bad rash all over body  . Dizziness and weakness   Immunizations Administered    Name Date Dose VIS Date Route   Pfizer COVID-19 Vaccine 10/26/2019  8:36 AM 0.3 mL 07/06/2019 Intramuscular   Manufacturer: [employer], [employer]   Lot: [id number]   NDC: [address]-5

## 2019-10-30 NOTE — Progress Notes (Signed)
[facility]  [address] [place], [place] [address] Phone: [phone]  Fax: [phone]   Clinic Day:  11/01/2019  Referring physician: Jerrilyn [facility]*  Chief Complaint: Austin Howell is a 54 y.o. male with bilateral pulmonary emboli who is seen for a 3 month assessment.   HPI: The patient was last seen in the hematology clinic on 08/02/2019 via telemedicine. At that time, he was doing well on Xarelto. He denied any bleeding. He remained on Xarelto 20 mg a day.   During the interim, the patient felt "ok". He denies any bleeding. He is active. he has been working 5 days a week since 09/24/2019. His rhinorrhea has resolved. He denies any leg swelling. He continues to wear his compression stockings some days. He received his first dose of the COVID-19 vaccine x 1 week ago. He has no bone or joint pain. Patient will consider a colonoscopy in the future. He will see Dr. Wyn Quaker on 11/30/2019 for an ultrasound.     Past Medical History:  Diagnosis Date  . High cholesterol   . History of blood clots   . Hypertension     Past Surgical History:  Procedure Laterality Date  . NO PAST SURGERIES      Family History  Problem Relation Age of Onset  . Cervical cancer Mother   . Lung cancer Father   . Cervical cancer Sister     Social History:  reports that he has never smoked. He has never used smokeless tobacco. He reports that he does not drink alcohol or use drugs. He denies any exposure to radiations of toxins. He has been working 5 days/week at [employer] in [place] since 09/24/2019. At work [employer] and walks on concrete floors. He has 3 daughters. Patient lives in [place], [place]. The patient is alone today.  Allergies: No Known Allergies  Current Medications: Current Outpatient Medications  Medication Sig Dispense Refill  . atorvastatin (LIPITOR) 10 MG tablet Take 10 mg by mouth daily at 6 PM.     .  lisinopril-hydrochlorothiazide (ZESTORETIC) 20-12.5 MG tablet Take 1 tablet by mouth daily.     . rivaroxaban (XARELTO) 20 MG TABS tablet Take 1 tablet (20 mg total) by mouth daily with supper. START TAKING ONLY AFTER YOU FINISH THE STARTER PACK 30 tablet 0  . acetaminophen (TYLENOL) 500 MG tablet Take 1,000 mg by mouth every 6 (six) hours as needed for fever.    Marland Kitchen albuterol (VENTOLIN HFA) 108 (90 Base) MCG/ACT inhaler Inhale 2 puffs into the lungs every 6 (six) hours as needed for wheezing or shortness of breath. (Patient not taking: Reported on 07/18/2019) 6.7 g 0  . sildenafil (VIAGRA) 25 MG tablet Take 25 mg by mouth daily as needed.     No current facility-administered medications for this visit.    Review of Systems  Constitutional: Negative.  Negative for chills, diaphoresis, fever, malaise/fatigue and weight loss (up 12 lbs).       Feels "ok". Active.  HENT: Negative for congestion, ear pain, hearing loss, nosebleeds, sinus pain and sore throat.        Rhinorrhea in the mornings; resolved.  Eyes: Negative.  Negative for blurred vision, double vision and photophobia.  Respiratory: Negative.  Negative for cough, hemoptysis, sputum production, shortness of breath and wheezing.   Cardiovascular: Negative for chest pain, palpitations, orthopnea and leg swelling (wears compression socks occasionally).  Gastrointestinal: Negative for abdominal pain, blood in stool, constipation, diarrhea,  melena, nausea and vomiting.       No prior colonoscopy.  Genitourinary: Negative.  Negative for dysuria, frequency, hematuria and urgency.  Musculoskeletal: Negative.  Negative for back pain, joint pain, myalgias and neck pain.  Skin: Negative.  Negative for itching and rash.  Neurological: Negative.  Negative for dizziness, tingling, sensory change, weakness and headaches.  Endo/Heme/Allergies: Does not bruise/bleed easily.  Psychiatric/Behavioral: Negative.  Negative for depression and memory loss. The  patient is not nervous/anxious and does not have insomnia.   All other systems reviewed and are negative.  Performance status (ECOG): 0  Vitals Blood pressure 130/77, pulse 60, temperature 97.7 F (36.5 C), temperature source Tympanic, resp. rate 18, height 6\' 3"  (1.905 m), weight (!) 337 lb 15.4 oz (153.3 kg), SpO2 99 %.   Physical Exam  Constitutional: He is oriented to person, place, and time. He appears well-developed and well-nourished. No distress.  HENT:  Head: Normocephalic and atraumatic.  Mouth/Throat: Oropharynx is clear and moist. No oropharyngeal exudate.  Short black hair. Mask.  Eyes: Pupils are equal, round, and reactive to light. Conjunctivae and EOM are normal. No scleral icterus.  Cardiovascular: Normal rate, regular rhythm and normal heart sounds.  No murmur heard. Pulmonary/Chest: Effort normal and breath sounds normal. No respiratory distress. He has no wheezes. He has no rales. He exhibits no tenderness.  Abdominal: Soft. Bowel sounds are normal. He exhibits no distension and no mass. There is no abdominal tenderness. There is no rebound and no guarding.  Musculoskeletal:        General: Edema (BLE; chronic) present. No tenderness. Normal range of motion.     Cervical back: Normal range of motion and neck supple.  Lymphadenopathy:    He has no cervical adenopathy.    He has no axillary adenopathy.       Right: No supraclavicular adenopathy present.       Left: No supraclavicular adenopathy present.  Neurological: He is alert and oriented to person, place, and time.  Skin: Skin is warm and dry. He is not diaphoretic.  Psychiatric: He has a normal mood and affect. His behavior is normal. Judgment and thought content normal.  Nursing note and vitals reviewed.   Appointment on 11/01/2019  Component Date Value Ref Range Status  . Sodium 11/01/2019 139  135 - 145 mmol/L Final  . Potassium 11/01/2019 3.7  3.5 - 5.1 mmol/L Final  . Chloride 11/01/2019 104  98 -  111 mmol/L Final  . CO2 11/01/2019 27  22 - 32 mmol/L Final  . Glucose, Bld 11/01/2019 98  70 - 99 mg/dL Final   Glucose reference range applies only to samples taken after fasting for at least 8 hours.  . BUN 11/01/2019 16  6 - 20 mg/dL Final  . Creatinine, Ser 11/01/2019 1.05  0.61 - 1.24 mg/dL Final  . Calcium 01/01/2020 8.8* 8.9 - 10.3 mg/dL Final  . Total Protein 11/01/2019 7.4  6.5 - 8.1 g/dL Final  . Albumin 01/01/2020 4.1  3.5 - 5.0 g/dL Final  . AST 54/00/8676 20  15 - 41 U/L Final  . ALT 11/01/2019 21  0 - 44 U/L Final  . Alkaline Phosphatase 11/01/2019 57  38 - 126 U/L Final  . Total Bilirubin 11/01/2019 0.5  0.3 - 1.2 mg/dL Final  . GFR calc non Af Amer 11/01/2019 >60  >60 mL/min Final  . GFR calc Af Amer 11/01/2019 >60  >60 mL/min Final  . Anion gap 11/01/2019 8  5 -  15 Final   Performed at [facility], [address], [place], [place] [address]  . WBC 11/01/2019 7.9  4.0 - 10.5 K/uL Final  . RBC 11/01/2019 5.06  4.22 - 5.81 MIL/uL Final  . Hemoglobin 11/01/2019 13.9  13.0 - 17.0 g/dL Final  . HCT 11/01/2019 42.4  39.0 - 52.0 % Final  . MCV 11/01/2019 83.8  80.0 - 100.0 fL Final  . MCH 11/01/2019 27.5  26.0 - 34.0 pg Final  . MCHC 11/01/2019 32.8  30.0 - 36.0 g/dL Final  . RDW 11/01/2019 13.2  11.5 - 15.5 % Final  . Platelets 11/01/2019 286  150 - 400 K/uL Final  . nRBC 11/01/2019 0.0  0.0 - 0.2 % Final  . Neutrophils Relative % 11/01/2019 55  % Final  . Neutro Abs 11/01/2019 4.4  1.7 - 7.7 K/uL Final  . Lymphocytes Relative 11/01/2019 34  % Final  . Lymphs Abs 11/01/2019 2.7  0.7 - 4.0 K/uL Final  . Monocytes Relative 11/01/2019 9  % Final  . Monocytes Absolute 11/01/2019 0.7  0.1 - 1.0 K/uL Final  . Eosinophils Relative 11/01/2019 2  % Final  . Eosinophils Absolute 11/01/2019 0.1  0.0 - 0.5 K/uL Final  . Basophils Relative 11/01/2019 0  % Final  . Basophils Absolute 11/01/2019 0.0  0.0 - 0.1 K/uL Final  . Immature Granulocytes 11/01/2019 0  % Final    . Abs Immature Granulocytes 11/01/2019 0.02  0.00 - 0.07 K/uL Final   Performed at [facility], [address], [place], [place] [address]    Assessment:  Jayant Kriz is a 55 y.o. male with multiple pulmonary emboli following COVID-19diagnosis on 06/09/2019. He presented with dehydration, acute hypoxic respiratory failure,and acute kidney injury. Creatinine was 2.79 (baseline 1.12). He is on Xarelto.  VQ scanon 06/19/2019 revealed multiple bilateral linear and wedge-shaped perfusion defects constituting High Probabilityofacutepulmonary embolism.Bilateral lower extremity duplex in [place], [place] on 06/20/2019 revealed acute DVT involving the right femoral vein, right proximal profunda vein, right popliteal vein, and the right posterior tibial veins. There was acute DVT involving the left popliteal vein.  Echoon 06/20/2019 revealed an EF of 60-65%.There was mildly increased LVH. The right atrium was severely dilated and the right ventricle mildly enlarged.   Work up on 07/18/2019 revealed hematocrit 39.5, hemoglobin 12.9, MCV 84.8, platelets 325,000, WBC 7,300.   Prothrombin gene mutation and Factor V Leiden were negative.  Lupus anticoagulant panel was positive on Xarelto (can interfere with testing).  Cardiolipin antibodies were negative.    He received his first dose of the COVID-19 vaccine 1 week ago.   Symptomatically, he feels "okay".  He denies any bleeding.  Exam reveals chronic lower extremity changes (right > left).  Plan: 1.   Labs today: CBC with diff, CMP, protein C activity and antigen, protein S activity and antigen, anti-thrombin III activity and antigen, beta-2 glycoprotein antibodies.  2. Bilateral pulmonary emboli Clinically, he is doing well.  Etiology of thrombosis felt secondary to COVID-19 and dehydration. He has no family history of thrombosis. Discuss unclear significance of  + lupus anticaogulant on  Xarelto.             Repeat lupus anticoagulant panel in future when off Xarelto (possibly with colonoscopy).                         Beta 2-glycoprotein antibodies were negative today. Anticipate 6 months of anticoagulation unless patient is +  for lupus anticoagulant on repeat testing off Xarelto. Continue Xarelto 20 mg a day. 3.RTC on 12/07/2019 for labs (D dimer). 4.   If D-dimer negative, then patient to stop Xarelto and RTC 2 days later for labs (lupus anticoagulant panel, anti-cardiolipin antibodies, beta2 glycoprotein antibodies) then restart Xarelto. 5.   RTC 1 week later for MD assessment and discussion regarding direction of therapy.  I discussed the assessment and treatment plan with the patient.  The patient was provided an opportunity to ask questions and all were answered.  The patient agreed with the plan and demonstrated an understanding of the instructions.  The patient was advised to call back if the symptoms worsen or if the condition fails to improve as anticipated.   Rosey Bath, MD, PhD    11/01/2019, 3:31 PM  I, Theador Hawthorne, am acting as scribe for [employer]. Merlene Pulling, MD, PhD.  I, Sadira Standard C. Merlene Pulling, MD, have reviewed the above documentation for accuracy and completeness, and I agree with the above.

## 2019-11-01 ENCOUNTER — Other Ambulatory Visit: Payer: Self-pay

## 2019-11-01 ENCOUNTER — Encounter: Payer: Self-pay | Admitting: Hematology and Oncology

## 2019-11-01 ENCOUNTER — Inpatient Hospital Stay: Payer: BC Managed Care – PPO

## 2019-11-01 ENCOUNTER — Inpatient Hospital Stay: Payer: BC Managed Care – PPO | Attending: Hematology and Oncology | Admitting: Hematology and Oncology

## 2019-11-01 VITALS — BP 130/77 | HR 60 | Temp 97.7°F | Resp 18 | Ht 75.0 in | Wt 338.0 lb

## 2019-11-01 DIAGNOSIS — I2699 Other pulmonary embolism without acute cor pulmonale: Secondary | ICD-10-CM | POA: Insufficient documentation

## 2019-11-01 DIAGNOSIS — I824Y1 Acute embolism and thrombosis of unspecified deep veins of right proximal lower extremity: Secondary | ICD-10-CM

## 2019-11-01 LAB — COMPREHENSIVE METABOLIC PANEL
ALT: 21 U/L (ref 0–44)
AST: 20 U/L (ref 15–41)
Albumin: 4.1 g/dL (ref 3.5–5.0)
Alkaline Phosphatase: 57 U/L (ref 38–126)
Anion gap: 8 (ref 5–15)
BUN: 16 mg/dL (ref 6–20)
CO2: 27 mmol/L (ref 22–32)
Calcium: 8.8 mg/dL — ABNORMAL LOW (ref 8.9–10.3)
Chloride: 104 mmol/L (ref 98–111)
Creatinine, Ser: 1.05 mg/dL (ref 0.61–1.24)
GFR calc Af Amer: >60 mL/min (ref >60)
GFR calc non Af Amer: >60 mL/min (ref >60)
Glucose, Bld: 98 mg/dL (ref 70–99)
Potassium: 3.7 mmol/L (ref 3.5–5.1)
Sodium: 139 mmol/L (ref 135–145)
Total Bilirubin: 0.5 mg/dL (ref 0.3–1.2)
Total Protein: 7.4 g/dL (ref 6.5–8.1)

## 2019-11-01 LAB — CBC WITH DIFFERENTIAL/PLATELET
Abs Immature Granulocytes: 0.02 10*3/uL (ref 0.00–0.07)
Basophils Absolute: 0 10*3/uL (ref 0.0–0.1)
Basophils Relative: 0 %
Eosinophils Absolute: 0.1 10*3/uL (ref 0.0–0.5)
Eosinophils Relative: 2 %
HCT: 42.4 % (ref 39.0–52.0)
Hemoglobin: 13.9 g/dL (ref 13.0–17.0)
Immature Granulocytes: 0 %
Lymphocytes Relative: 34 %
Lymphs Abs: 2.7 10*3/uL (ref 0.7–4.0)
MCH: 27.5 pg (ref 26.0–34.0)
MCHC: 32.8 g/dL (ref 30.0–36.0)
MCV: 83.8 fL (ref 80.0–100.0)
Monocytes Absolute: 0.7 10*3/uL (ref 0.1–1.0)
Monocytes Relative: 9 %
Neutro Abs: 4.4 10*3/uL (ref 1.7–7.7)
Neutrophils Relative %: 55 %
Platelets: 286 10*3/uL (ref 150–400)
RBC: 5.06 MIL/uL (ref 4.22–5.81)
RDW: 13.2 % (ref 11.5–15.5)
WBC: 7.9 10*3/uL (ref 4.0–10.5)
nRBC: 0 % (ref 0.0–0.2)

## 2019-11-01 NOTE — Progress Notes (Signed)
No new changes noted today 

## 2019-11-02 LAB — ANTITHROMBIN PANEL
AT III AG PPP IMM-ACNC: 90 % (ref 72–124)
Antithrombin Activity: 129 % (ref 75–135)

## 2019-11-02 LAB — PROTEIN C ACTIVITY: Protein C Activity: 126 % (ref 73–180)

## 2019-11-02 LAB — BETA-2-GLYCOPROTEIN I ABS, IGG/M/A
Beta-2 Glyco I IgG: <9 GPI IgG units (ref 0–20)
Beta-2-Glycoprotein I IgA: <9 GPI IgA units (ref 0–25)
Beta-2-Glycoprotein I IgM: <9 GPI IgM units (ref 0–32)

## 2019-11-02 LAB — PROTEIN S ACTIVITY: Protein S Activity: 131 % (ref 63–140)

## 2019-11-06 LAB — PROTEIN C AG + PROTEIN S AG
Protein C, Total: 89 % (ref 60–150)
Protein S Ag, Free: 97 % (ref 57–157)
Protein S Ag, Total: 80 % (ref 60–150)

## 2019-11-20 ENCOUNTER — Ambulatory Visit: Payer: BC Managed Care – PPO | Attending: Internal Medicine

## 2019-11-20 DIAGNOSIS — Z23 Encounter for immunization: Secondary | ICD-10-CM

## 2019-11-20 NOTE — Progress Notes (Signed)
   Covid-19 Vaccination Clinic  Name:  Austin Howell    MRN: [id number] DOB: [birth year]  11/20/2019  [name] was observed post Covid-19 immunization for 15 minutes without incident. He was provided with Vaccine Information Sheet and instruction to access the V-Safe system.   [name] was instructed to call 911 with any severe reactions post vaccine: Marland Kitchen Difficulty breathing  . Swelling of face and throat  . A fast heartbeat  . A bad rash all over body  . Dizziness and weakness   Immunizations Administered    Name Date Dose VIS Date Route   Pfizer COVID-19 Vaccine 11/20/2019 10:54 AM 0.3 mL 09/19/2018 Intramuscular   Manufacturer: [employer], [employer]   Lot: [id number]   NDC: [address]-5

## 2019-11-30 ENCOUNTER — Encounter (INDEPENDENT_AMBULATORY_CARE_PROVIDER_SITE_OTHER): Payer: BC Managed Care – PPO

## 2019-11-30 ENCOUNTER — Ambulatory Visit (INDEPENDENT_AMBULATORY_CARE_PROVIDER_SITE_OTHER): Payer: BC Managed Care – PPO | Admitting: Vascular Surgery

## 2019-12-07 ENCOUNTER — Inpatient Hospital Stay: Payer: BC Managed Care – PPO | Attending: Hematology and Oncology

## 2019-12-14 ENCOUNTER — Inpatient Hospital Stay: Payer: BC Managed Care – PPO | Admitting: Hematology and Oncology

## 2019-12-14 ENCOUNTER — Inpatient Hospital Stay: Payer: BC Managed Care – PPO

## 2020-01-25 ENCOUNTER — Encounter (INDEPENDENT_AMBULATORY_CARE_PROVIDER_SITE_OTHER): Payer: BC Managed Care – PPO

## 2020-01-25 ENCOUNTER — Ambulatory Visit (INDEPENDENT_AMBULATORY_CARE_PROVIDER_SITE_OTHER): Payer: BC Managed Care – PPO | Admitting: Vascular Surgery

## 2020-03-18 ENCOUNTER — Encounter (INDEPENDENT_AMBULATORY_CARE_PROVIDER_SITE_OTHER): Payer: Self-pay | Admitting: Vascular Surgery

## 2021-05-16 IMAGING — DX DG CHEST 1V PORT
1 series · 1 of 1 positions shown · non-contrast
Comparison: None.

CLINICAL DATA: Dyspnea, [id number] positive

EXAM:
PORTABLE CHEST 1 VIEW

[chest ap]
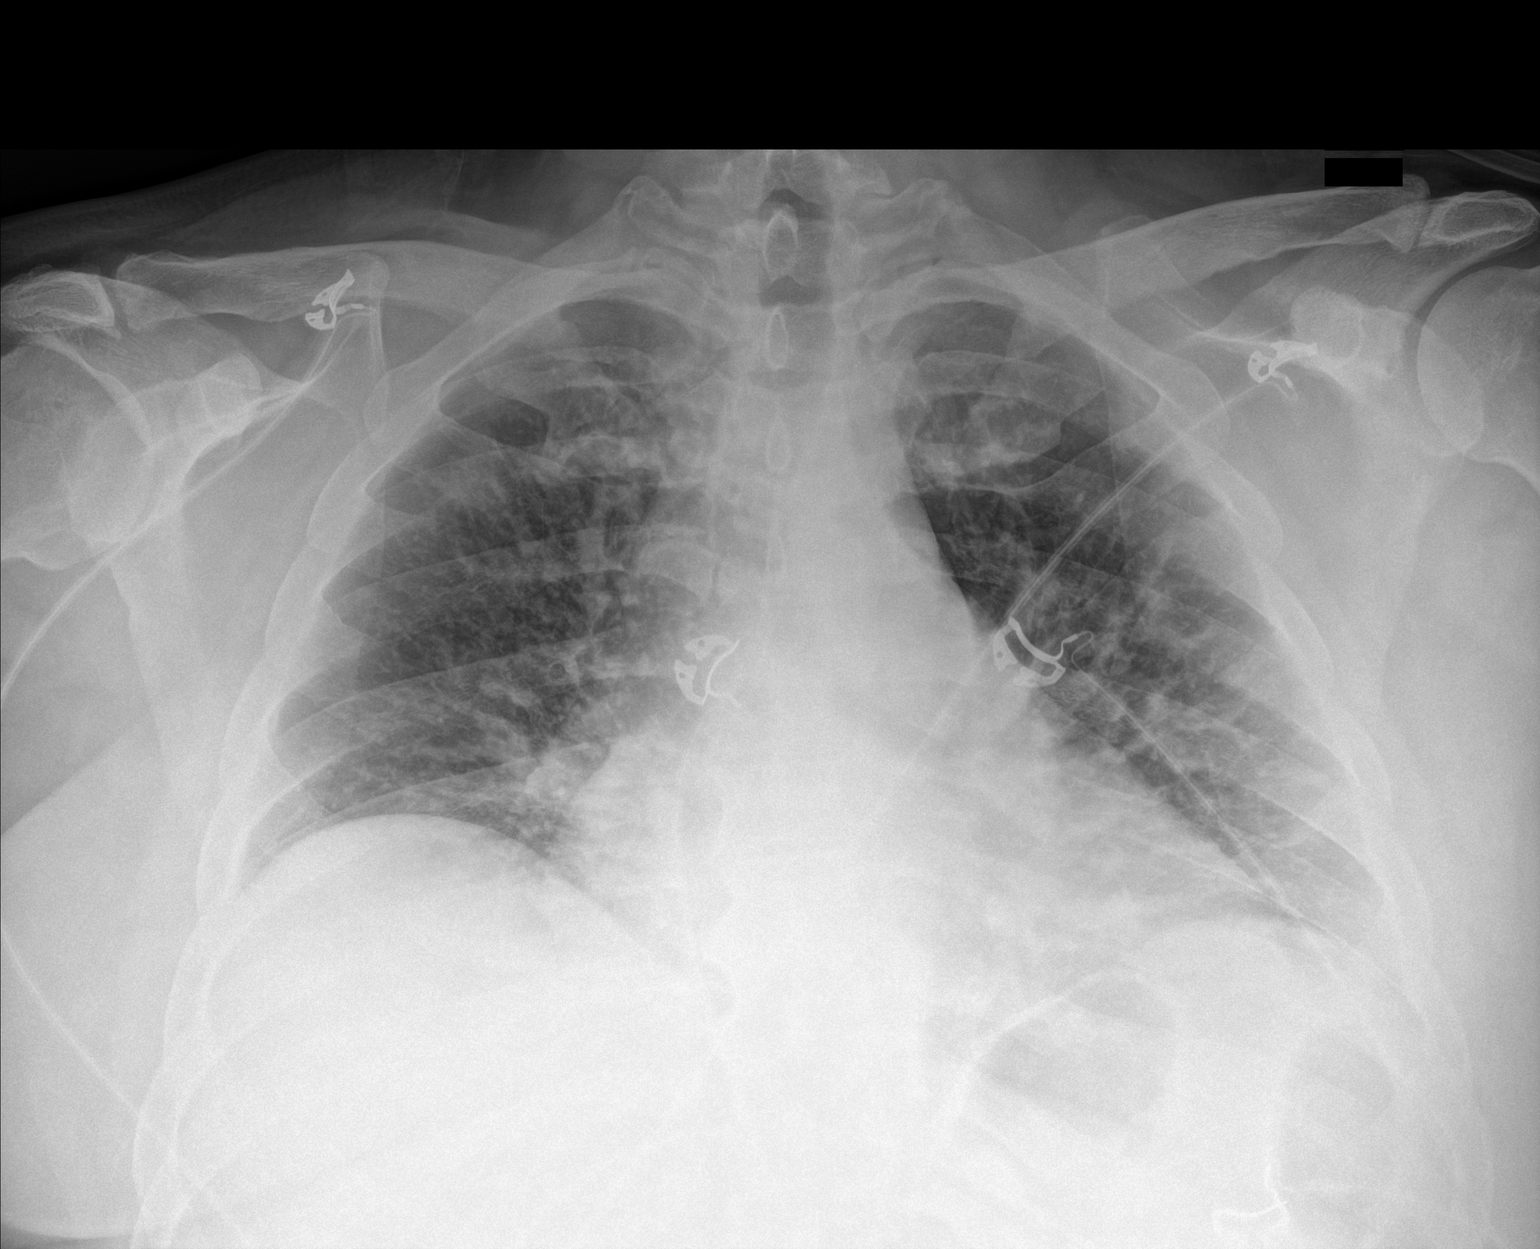

[1 of 1 positions shown; findings below may reference images not displayed]

FINDINGS: Low lung volumes. Borderline mild enlargement of the
cardiopericardial silhouette. Normal mediastinal contour. No
pneumothorax. No pleural effusion. Hazy patchy parahilar and lower
lung opacities bilaterally.
IMPRESSION: Borderline mild enlargement of the cardiopericardial silhouette.
Hazy patchy parahilar and lower lung opacities bilaterally, which
could represent atypical pneumonia versus mild pulmonary edema.
